# Patient Record
Sex: Male | Born: 1985 | Race: Asian | Hispanic: No | Marital: Single | Smoking: Never smoker
Health system: Southern US, Community
[De-identification: ages and names within clinical notes are randomized; demographics above are authoritative.]

## PROBLEM LIST (undated history)

## (undated) DIAGNOSIS — F840 Autistic disorder: Secondary | ICD-10-CM

## (undated) HISTORY — PX: NO PAST SURGERIES: SHX2092

---
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
## 2011-05-08 ENCOUNTER — Emergency Department (HOSPITAL_COMMUNITY)
Admission: EM | Admit: 2011-05-08 | Discharge: 2011-05-09 | Disposition: A | Discharge status: Home / Self Care | Payer: Medicare Other | Attending: Emergency Medicine | Admitting: Emergency Medicine

## 2011-05-08 ENCOUNTER — Encounter (HOSPITAL_COMMUNITY): Payer: Self-pay | Admitting: Emergency Medicine

## 2011-05-08 DIAGNOSIS — E871 Hypo-osmolality and hyponatremia: Secondary | ICD-10-CM

## 2011-05-08 DIAGNOSIS — G4700 Insomnia, unspecified: Secondary | ICD-10-CM | POA: Insufficient documentation

## 2011-05-08 DIAGNOSIS — F840 Autistic disorder: Secondary | ICD-10-CM | POA: Insufficient documentation

## 2011-05-08 DIAGNOSIS — R10819 Abdominal tenderness, unspecified site: Secondary | ICD-10-CM | POA: Insufficient documentation

## 2011-05-08 DIAGNOSIS — R109 Unspecified abdominal pain: Secondary | ICD-10-CM | POA: Insufficient documentation

## 2011-05-08 HISTORY — DX: Autistic disorder: F84.0

## 2011-05-08 LAB — CBC
HCT: 40.2 % (ref 39.0–52.0)
Hemoglobin: 13.7 g/dL (ref 13.0–17.0)
MCH: 26.9 pg (ref 26.0–34.0)
MCHC: 34.1 g/dL (ref 30.0–36.0)
MCV: 78.8 fL (ref 78.0–100.0)
Platelets: 299 10*3/uL (ref 150–400)
RBC: 5.1 MIL/uL (ref 4.22–5.81)
RDW: 12.4 % (ref 11.5–15.5)
WBC: 11.9 10*3/uL — ABNORMAL HIGH (ref 4.0–10.5)

## 2011-05-08 LAB — DIFFERENTIAL
Basophils Relative: 0 % (ref 0–1)
Eosinophils Absolute: 0.3 10*3/uL (ref 0.0–0.7)
Eosinophils Relative: 3 % (ref 0–5)
Monocytes Relative: 5 % (ref 3–12)
Neutrophils Relative %: 70 % (ref 43–77)

## 2011-05-08 MED ORDER — SODIUM CHLORIDE 0.9 % IV BOLUS (SEPSIS)
1000.0000 mL | Freq: Once | INTRAVENOUS | Status: AC
  Administered 2011-05-08: 1000 mL via INTRAVENOUS

## 2011-05-08 NOTE — ED Notes (Signed)
CBG 101 

## 2011-05-08 NOTE — ED Notes (Signed)
ZOX:[id number]<AV> Expected date:05/08/11<BR> Expected time: 9:43 PM<BR> Means of arrival:Ambulance<BR> Comments:<BR> PTAR- Autistic male, abdominal pain, increased PO H2O intake. Non-english speaking family

## 2011-05-08 NOTE — ED Provider Notes (Addendum)
History     CSN: [id number]  Arrival date & time 05/08/11  2149   First MD Initiated Contact with Patient 05/08/11 2152      Chief Complaint  Patient presents with  . Abdominal Pain  . Insomnia     HPI  History provided by the patient's brother and parents. Patient is a 26 year old autistic male who presents complaints of abdominal pain, polydipsia and polyuria for the past 2 days. Patient has continued to eat normally but parents and brother state patient has-been drinking water very frequently. Patient also seemed to indicate that he was having abdominal discomfort and pains. There is no reported aggravating or alleviating factors. There have been no reports of fever, vomiting, diarrhea or constipation. There are no reports of changes in urination. Patient has no surgical history. Patient has no other medical history. Patient's father does have history of diabetes.    Past Medical History  Diagnosis Date  . Autism     History reviewed. No pertinent past surgical history.  History reviewed. No pertinent family history.  History  Substance Use Topics  . Smoking status: Never Smoker   . Smokeless tobacco: Never Used  . Alcohol Use: No      Review of Systems  Unable to perform ROS  level V applies due to his autism  Allergies  Review of patient's allergies indicates no known allergies.  Home Medications  No current outpatient prescriptions on file.  BP 118/77  Pulse 101  Temp(Src) 97.8 F (36.6 C) (Oral)  Resp 20  SpO2 99%  Physical Exam  Nursing note and vitals reviewed. Constitutional: He appears well-developed and well-nourished. No distress.  HENT:  Head: Normocephalic and atraumatic.  Mouth/Throat: Oropharynx is clear and moist.  Cardiovascular: Normal rate and regular rhythm.   Pulmonary/Chest: Effort normal and breath sounds normal. No respiratory distress. He has no wheezes. He has no rales.  Abdominal: Soft. Normal appearance and bowel sounds are  normal. He exhibits no distension. There is no rebound, no guarding, no CVA tenderness, no tenderness at McBurney's point and negative Murphy's sign.       Obese. Diffuse mild tenderness  Neurological: He is alert.  Skin: He is diaphoretic.    ED Course  Procedures    Labs Reviewed  POCT CBG MONITORING  CBC  DIFFERENTIAL  COMPREHENSIVE METABOLIC PANEL  LIPASE, BLOOD  URINALYSIS, ROUTINE W REFLEX MICROSCOPIC   Results for orders placed during the hospital encounter of 05/08/11  CBC      Component Value Range   WBC 11.9 (*) 4.0 - 10.5 (K/uL)   RBC 5.10  4.22 - 5.81 (MIL/uL)   Hemoglobin 13.7  13.0 - 17.0 (g/dL)   HCT 78.2  95.6 - 21.3 (%)   MCV 78.8  78.0 - 100.0 (fL)   MCH 26.9  26.0 - 34.0 (pg)   MCHC 34.1  30.0 - 36.0 (g/dL)   RDW 08.6  57.8 - 46.9 (%)   Platelets 299  150 - 400 (K/uL)  DIFFERENTIAL      Component Value Range   Neutrophils Relative 70  43 - 77 (%)   Neutro Abs 8.3 (*) 1.7 - 7.7 (K/uL)   Lymphocytes Relative 22  12 - 46 (%)   Lymphs Abs 2.6  0.7 - 4.0 (K/uL)   Monocytes Relative 5  3 - 12 (%)   Monocytes Absolute 0.6  0.1 - 1.0 (K/uL)   Eosinophils Relative 3  0 - 5 (%)   Eosinophils Absolute 0.3  0.0 - 0.7 (K/uL)   Basophils Relative 0  0 - 1 (%)   Basophils Absolute 0.0  0.0 - 0.1 (K/uL)  COMPREHENSIVE METABOLIC PANEL      Component Value Range   Sodium 127 (*) 135 - 145 (mEq/L)   Potassium 3.5  3.5 - 5.1 (mEq/L)   Chloride 93 (*) 96 - 112 (mEq/L)   CO2 22  19 - 32 (mEq/L)   Glucose, Bld 105 (*) 70 - 99 (mg/dL)   BUN 6  6 - 23 (mg/dL)   Creatinine, Ser 1.61  0.50 - 1.35 (mg/dL)   Calcium 8.9  8.4 - 09.6 (mg/dL)   Total Protein 7.8  6.0 - 8.3 (g/dL)   Albumin 3.7  3.5 - 5.2 (g/dL)   AST 24  0 - 37 (U/L)   ALT 28  0 - 53 (U/L)   Alkaline Phosphatase 65  39 - 117 (U/L)   Total Bilirubin 0.7  0.3 - 1.2 (mg/dL)   GFR calc non Af Amer >90  >90 (mL/min)   GFR calc Af Amer >90  >90 (mL/min)  LIPASE, BLOOD      Component Value Range   Lipase 37   11 - 59 (U/L)  URINALYSIS, ROUTINE W REFLEX MICROSCOPIC      Component Value Range   Color, Urine YELLOW  YELLOW    APPearance CLEAR  CLEAR    Specific Gravity, Urine 1.002 (*) 1.005 - 1.030    pH 6.5  5.0 - 8.0    Glucose, UA NEGATIVE  NEGATIVE (mg/dL)   Hgb urine dipstick TRACE (*) NEGATIVE    Bilirubin Urine NEGATIVE  NEGATIVE    Ketones, ur NEGATIVE  NEGATIVE (mg/dL)   Protein, ur NEGATIVE  NEGATIVE (mg/dL)   Urobilinogen, UA 0.2  0.0 - 1.0 (mg/dL)   Nitrite NEGATIVE  NEGATIVE    Leukocytes, UA NEGATIVE  NEGATIVE   URINE MICROSCOPIC-ADD ON      Component Value Range   Squamous Epithelial / LPF RARE  RARE    RBC / HPF 0-2  <3 (RBC/hpf)  GLUCOSE, CAPILLARY      Component Value Range   Glucose-Capillary 101 (*) 70 - 99 (mg/dL)     Ct Abdomen Pelvis W Contrast  05/09/2011  *RADIOLOGY REPORT*  Clinical Data: Abdominal pain.  Insomnia and increased thirst. White cell count 11.9.  CT ABDOMEN AND PELVIS WITH CONTRAST  Technique:  Multidetector CT imaging of the abdomen and pelvis was performed following the standard protocol during bolus administration of intravenous contrast.  Contrast: OMNIPAQUE IOHEXOL 300 MG/ML IV SOLN  Comparison: None.  Findings: Lung bases are clear other than respiratory motion artifact.  The liver, spleen, gallbladder, pancreas, adrenal glands, kidneys, abdominal aorta, and retroperitoneal lymph nodes are unremarkable. The stomach and small bowel are decompressed.  Stool filled colon without wall thickening or distension.  No free air or free fluid in the abdomen.  Pelvis:  Bladder wall is not thickened.  Prostate gland is not enlarged.  No free or loculated pelvic fluid collections.  No significant pelvic lymphadenopathy.  No inflammatory changes in the sigmoid colon.  The appendix is normal.  Normal alignment of the lumbar vertebra.  No destructive bone lesions appreciated.  IMPRESSION: No acute process demonstrated in the abdomen or pelvis.  Original Report  Authenticated By: Marlon Pel, M.D.     1. Hyponatremia       MDM  10:50PM patient seen and evaluated. Patient no acute distress.   12:30 AM  patient continues to be resting well discussed with family patient's current lab results. CT still pending.     Angus Seller, PA 05/09/11 0147  Angus Seller, PA 05/09/11 [phone]

## 2011-05-08 NOTE — ED Notes (Signed)
Per ems, the patient father states the child has had increased thirst for the past 2 days and abd pain with no n/v/d and has not slept any for 2 days

## 2011-05-09 ENCOUNTER — Emergency Department (HOSPITAL_COMMUNITY): Payer: Medicare Other

## 2011-05-09 LAB — URINALYSIS, ROUTINE W REFLEX MICROSCOPIC
Bilirubin Urine: NEGATIVE
Leukocytes, UA: NEGATIVE
Nitrite: NEGATIVE
Specific Gravity, Urine: 1.002 — ABNORMAL LOW (ref 1.005–1.030)
Urobilinogen, UA: 0.2 mg/dL (ref 0.0–1.0)
pH: 6.5 (ref 5.0–8.0)

## 2011-05-09 LAB — COMPREHENSIVE METABOLIC PANEL
Albumin: 3.7 g/dL (ref 3.5–5.2)
Alkaline Phosphatase: 65 U/L (ref 39–117)
BUN: 6 mg/dL (ref 6–23)
Calcium: 8.9 mg/dL (ref 8.4–10.5)
Potassium: 3.5 mEq/L (ref 3.5–5.1)
Sodium: 127 mEq/L — ABNORMAL LOW (ref 135–145)
Total Protein: 7.8 g/dL (ref 6.0–8.3)

## 2011-05-09 LAB — GLUCOSE, CAPILLARY: Glucose-Capillary: 101 mg/dL — ABNORMAL HIGH (ref 70–99)

## 2011-05-09 LAB — URINE MICROSCOPIC-ADD ON

## 2011-05-09 LAB — SODIUM, URINE, RANDOM: Sodium, Ur: 10 mEq/L

## 2011-05-09 LAB — LIPASE, BLOOD: Lipase: 37 U/L (ref 11–59)

## 2011-05-09 MED ORDER — IOHEXOL 300 MG/ML  SOLN
100.0000 mL | Freq: Once | INTRAMUSCULAR | Status: AC | PRN
  Administered 2011-05-09: 100 mL via INTRAVENOUS

## 2011-05-09 MED ORDER — HYDROCODONE-ACETAMINOPHEN 5-325 MG PO TABS: 1.0000 | ORAL_TABLET | ORAL | Status: AC | PRN

## 2011-05-09 MED ORDER — MORPHINE SULFATE 2 MG/ML IJ SOLN
2.0000 mg | Freq: Once | INTRAMUSCULAR | Status: AC
  Administered 2011-05-09: 2 mg via INTRAVENOUS
  Filled 2011-05-09: qty 1

## 2011-05-09 NOTE — Discharge Instructions (Signed)
You were seen and evaluated today for your symptoms of abdominal pains and excessive thirst. At this time your lab to show that your sodium level is low to 2 year excessive drinking of water. It is recommended that you stop drinking plain water and only drinking juice, Gatorade or soda. You should call your primary doctor later today to schedule a followup appointment to have a recheck of your sodium level this week. If you have any worsening symptoms please return to the emergency room.  Hyponatremia  Hyponatremia is when the amount of salt (sodium) in your blood is too low. When sodium levels are low, your cells will absorb extra water and swell. The swelling happens throughout the body, but it mostly affects the brain. Severe brain swelling (cerebral edema), seizures, or coma can happen.  CAUSES   Heart, kidney, or liver problems.   Thyroid problems.   Adrenal gland problems.   Severe vomiting and diarrhea.   Certain medicines or illegal drugs.   Dehydration.   Drinking too much water.   Low-sodium diet.  SYMPTOMS   Nausea and vomiting.   Confusion.   Lethargy.   Agitation.   Headache.   Twitching or shaking (seizures).   Unconsciousness.   Appetite loss.   Muscle weakness and cramping.  DIAGNOSIS  Hyponatremia is identified by a simple blood test. Your caregiver will perform a history and physical exam to try to find the cause and type of hyponatremia. Other tests may be needed to measure the amount of sodium in your blood and urine. TREATMENT  Treatment will depend on the cause.   Fluids may be given through the vein (IV).   Medicines may be used to correct the sodium imbalance. If medicines are causing the problem, they will need to be adjusted.   Water or fluid intake may be restricted to restore proper balance.  The speed of correcting the sodium problem is very important. If the problem is corrected too fast, nerve damage (sometimes unchangeable) can  happen. HOME CARE INSTRUCTIONS   Only take medicines as directed by your caregiver. Many medicines can make hyponatremia worse. Discuss all your medicines with your caregiver.   Carefully follow any recommended diet, including any fluid restrictions.   You may be asked to repeat lab tests. Follow these directions.   Avoid alcohol and recreational drugs.  SEEK MEDICAL CARE IF:   You develop worsening nausea, fatigue, headache, confusion, or weakness.   Your original hyponatremia symptoms return.   You have problems following the recommended diet.  SEEK IMMEDIATE MEDICAL CARE IF:   You have a seizure.   You faint.   You have ongoing diarrhea or vomiting.  MAKE SURE YOU:   Understand these instructions.   Will watch your condition.   Will get help right away if you are not doing well or get worse.  Document Released: 03/18/2002 Document Revised: 12/08/2010 Document Reviewed: 09/12/2010 [facility] Patient Information 2012 [place], [place].   RESOURCE GUIDE  Dental Problems  Patients with Medicaid: [facility] [phone] [address]                                           [phone] Phone:  [phone]  Phone:  [phone]  If unable to pay or uninsured, contact:  [facility] or [facility]. to become qualified for the adult dental clinic.  Chronic Pain Problems Contact [facility]  [phone] Patients need to be referred by their primary care doctor.  Insufficient Money for Medicine Contact [employer]:  call "211" or [facility] [phone].  No Primary Care Doctor Call [facility]  [phone] Other agencies that provide inexpensive medical care    [facility]  [phone]    [facility]  [phone]    [facility]  [phone]    [facility]  [phone]    [facility]  [phone]     [facility]  [phone]  Psychological Services [facility]  [phone] [facility]  [phone] [facility]   [phone] (emergency services [phone])  Substance Abuse Resources [facility]  [phone] [facility] [phone] [facility] [phone] Floyd[facility] Flock [phone] Residential & Outpatient Substance Abuse Program  [phone]  Abuse/Neglect [facility] Child Abuse Hotline [phone] [facility] Child Abuse Hotline 684-674-7741 (After Hours)  Emergency Shelter [facility] [phone]  Maternity Homes [facility] at [facility] [place] [facility] [phone] [facility] [phone]  MRSA Hotline #:   303 683 2043    [facility] Resources  [facility]     [employer]                          [facility] Dept. [address] [place]                       [address]      [address]  Blondell Reveal Phone:  [phone]                                   Phone:  [phone]                 Phone:  [phone]  [facility] Phone:  [phone]  [facility] Child Abuse Hotline 902-434-9330 [phone] (After Hours)

## 2011-05-09 NOTE — ED Provider Notes (Addendum)
Medical screening examination/treatment/procedure(s) were conducted as a shared visit with non-physician practitioner(s) and myself.  I personally evaluated the patient during the encounter Pt noted to have hyponatremia.  No old labs on file to compare to to assess chronicity.  Parents do mention increased water intake.  Mental status has been unchanged.  Pt does have autism.  ?psyychogenic polydypsia.  No abnormalities noted on CT.  Will check urine and serum osmolality.  Will have him follow up with his PCP this week.  Limit free water intake. Celene Kras, MD 05/09/11 1610  Celene Kras, MD 05/09/11 [phone]

## 2012-01-12 NOTE — H&P (Signed)
  Nehemiah Mcfarren is an 26 y.o. male.   Chief Complaint: "Bad Teeth" HPI: Marquist is a 26 year old male with a history of severe autism.  He was referred to my office for evaluation and removal of several carious teeth. We will be performing the surgical removal of the offending teeth in the operating room due to Wyland's history of severe autism.  PMHx:  Past Medical History  Diagnosis Date  . Autism     PSx: No past surgical history on file.  Family Hx: Father is diabetic  Social History:  reports that he has never smoked. He has never used smokeless tobacco. He reports that he does not drink alcohol. His drug history not on file.  Allergies: No Known Allergies  Meds:  No prescriptions prior to admission    Labs: No results found for this or any previous visit (from the past 48 hour(s)).  Radiology:  ROS: A comprehensive review of systems was negative.  Vitals: BP: 138/86, Pulse:105,  SpO2:98%, Resp: 44,  Temp: 37.3 degrees celsius.  Physical Exam: General appearance: alert, cooperative and slowed mentation Head: Normocephalic, without obvious abnormality, atraumatic Eyes: conjunctivae/corneas clear. PERRL, EOM's intact. Fundi benign. Ears: normal TM's and external ear canals both ears Nose: Nares normal. Septum midline. Mucosa normal. No drainage or sinus tenderness. Throat: lips, mucosa, and tongue normal; teeth and gums normal Neck: no adenopathy, no carotid bruit, no JVD, supple, symmetrical, trachea midline and thyroid not enlarged, symmetric, no tenderness/mass/nodules Resp: clear to auscultation bilaterally Chest wall: no tenderness Cardio: regular rate and rhythm, S1, S2 normal, no murmur, click, rub or gallop GI: soft, non-tender; bowel sounds normal; no masses,  no organomegaly Extremities: extremities normal, atraumatic, no cyanosis or edema Pulses: 2+ and symmetric Skin: Skin color, texture, turgor normal. No rashes or lesions Lymph nodes: Cervical,  supraclavicular, and axillary nodes normal. Neurologic: Grossly normal Oral exam: Carious teeth #1, 2, 3, 17, 18, 30, 31.  Impacted teeth 16, 17 (soft tissue), 18 (soft tissue), 31 (soft tissue), 32    Assessment/Plan Mr. Bergeson has a history of autism and multiple carious teeth and impacted teeth.  He will be going to the OR for extraction of #1, 2, 3, 16, 17, 18, 30, 31, 32 and any other necessary teeth.  Lockney,Amarie Tarte L  01/12/2012, 5:06 PM

## 2012-01-17 ENCOUNTER — Encounter (HOSPITAL_COMMUNITY): Payer: Self-pay | Admitting: Pharmacy Technician

## 2012-01-19 ENCOUNTER — Encounter (HOSPITAL_COMMUNITY)
Admission: RE | Admit: 2012-01-19 | Discharge: 2012-01-19 | Disposition: A | Discharge status: Home / Self Care | Payer: Medicare Other | Source: Ambulatory Visit | Attending: Oral and Maxillofacial Surgery | Admitting: Oral and Maxillofacial Surgery

## 2012-01-19 ENCOUNTER — Encounter (HOSPITAL_COMMUNITY): Payer: Self-pay

## 2012-01-19 NOTE — Pre-Procedure Instructions (Signed)
[address] Eliceo Gladu  01/19/2012   Your procedure is scheduled on:  01-25-2012  Report to [facility] at 6:30 AM.  Call this number if you have problems the morning of surgery: [phone]   Remember:   Do not eat food or drink:After Midnight.      Take these medicines the morning of surgery with A SIP OF WATER: none   Do not wear jewelry, make-up or nail polish.  Do not wear lotions, powders, or perfumes. You may wear deodorant.  Do not shave 48 hours prior to surgery. Men may shave face and neck.  Do not bring valuables to the hospital.  Contacts, dentures or bridgework may not be worn into surgery.  . .   Patients discharged the day of surgery will not be allowed to drive home.    Name and phone number of your driver: ___________________    Special Instructions: Shower using CHG 2 nights before surgery and the night before surgery.  If you shower the day of surgery use CHG.  Use special wash - you have one bottle of CHG for all showers.  You should use approximately 1/3 of the bottle for each shower.   Please read over the following fact sheets that you were given: Pain Booklet and Surgical Site Infection Prevention

## 2012-01-19 NOTE — Progress Notes (Signed)
Spoke with Jackson Montoya, anesthesia PA. Does not need to speak with family today pt. Healthy no medical problems ,not on any routine medications.

## 2012-01-24 MED ORDER — DEXTROSE 5 % IV SOLN
3.0000 g | INTRAVENOUS | Status: AC
  Administered 2012-01-25: 3 g via INTRAVENOUS
  Filled 2012-01-24 (×3): qty 3000

## 2012-01-25 ENCOUNTER — Encounter (HOSPITAL_COMMUNITY): Payer: Self-pay | Admitting: *Deleted

## 2012-01-25 ENCOUNTER — Encounter (HOSPITAL_COMMUNITY)
Admission: RE | Disposition: A | Discharge status: Home / Self Care | Payer: Self-pay | Source: Ambulatory Visit | Attending: Oral and Maxillofacial Surgery

## 2012-01-25 ENCOUNTER — Encounter (HOSPITAL_COMMUNITY): Payer: Self-pay | Admitting: Critical Care Medicine

## 2012-01-25 ENCOUNTER — Ambulatory Visit (HOSPITAL_COMMUNITY): Payer: Medicare Other | Admitting: Critical Care Medicine

## 2012-01-25 ENCOUNTER — Ambulatory Visit (HOSPITAL_COMMUNITY)
Admission: RE | Admit: 2012-01-25 | Discharge: 2012-01-25 | Disposition: A | Discharge status: Home / Self Care | Payer: Medicare Other | Source: Ambulatory Visit | Attending: Oral and Maxillofacial Surgery | Admitting: Oral and Maxillofacial Surgery

## 2012-01-25 DIAGNOSIS — K011 Impacted teeth: Secondary | ICD-10-CM | POA: Diagnosis present

## 2012-01-25 DIAGNOSIS — K029 Dental caries, unspecified: Secondary | ICD-10-CM

## 2012-01-25 DIAGNOSIS — K006 Disturbances in tooth eruption: Secondary | ICD-10-CM | POA: Insufficient documentation

## 2012-01-25 DIAGNOSIS — F840 Autistic disorder: Secondary | ICD-10-CM | POA: Insufficient documentation

## 2012-01-25 HISTORY — PX: TOOTH EXTRACTION: SHX859

## 2012-01-25 SURGERY — EXTRACTION, TOOTH, MOLAR
Anesthesia: General | Site: Mouth | Laterality: Bilateral | Wound class: Clean Contaminated

## 2012-01-25 MED ORDER — LIDOCAINE HCL (CARDIAC) 20 MG/ML IV SOLN
INTRAVENOUS | Status: DC | PRN
  Administered 2012-01-25: 100 mg via INTRAVENOUS

## 2012-01-25 MED ORDER — CEFAZOLIN SODIUM 1-5 GM-% IV SOLN
INTRAVENOUS | Status: AC
  Filled 2012-01-25: qty 50

## 2012-01-25 MED ORDER — OXYCODONE HCL 5 MG/5ML PO SOLN: 5.0000 mg | Freq: Once | ORAL | Status: DC | PRN

## 2012-01-25 MED ORDER — FENTANYL CITRATE 0.05 MG/ML IJ SOLN
INTRAMUSCULAR | Status: DC | PRN
  Administered 2012-01-25: 150 ug via INTRAVENOUS
  Administered 2012-01-25: 50 ug via INTRAVENOUS

## 2012-01-25 MED ORDER — CEFAZOLIN SODIUM-DEXTROSE 2-3 GM-% IV SOLR
INTRAVENOUS | Status: AC
  Filled 2012-01-25: qty 50

## 2012-01-25 MED ORDER — ISOPROPYL ALCOHOL 70 % SOLN
Status: DC | PRN
  Administered 2012-01-25: 1 via TOPICAL

## 2012-01-25 MED ORDER — LIDOCAINE-EPINEPHRINE 2 %-1:100000 IJ SOLN
INTRAMUSCULAR | Status: AC
  Filled 2012-01-25: qty 6.8

## 2012-01-25 MED ORDER — BUPIVACAINE-EPINEPHRINE 0.5% -1:200000 IJ SOLN
INTRAMUSCULAR | Status: DC | PRN
  Administered 2012-01-25: 4 mL

## 2012-01-25 MED ORDER — BUPIVACAINE-EPINEPHRINE (PF) 0.5% -1:200000 IJ SOLN
INTRAMUSCULAR | Status: AC
  Filled 2012-01-25: qty 7.2

## 2012-01-25 MED ORDER — THROMBIN 20000 UNITS EX KIT
PACK | CUTANEOUS | Status: DC | PRN
  Administered 2012-01-25: 09:00:00 via TOPICAL

## 2012-01-25 MED ORDER — PROPOFOL 10 MG/ML IV BOLUS
INTRAVENOUS | Status: DC | PRN
  Administered 2012-01-25: 200 mg via INTRAVENOUS

## 2012-01-25 MED ORDER — SUCCINYLCHOLINE CHLORIDE 20 MG/ML IJ SOLN
INTRAMUSCULAR | Status: DC | PRN
  Administered 2012-01-25: 100 mg via INTRAVENOUS

## 2012-01-25 MED ORDER — DROPERIDOL 2.5 MG/ML IJ SOLN: 0.6250 mg | INTRAMUSCULAR | Status: DC | PRN

## 2012-01-25 MED ORDER — ONDANSETRON HCL 4 MG/2ML IJ SOLN
INTRAMUSCULAR | Status: DC | PRN
  Administered 2012-01-25: 4 mg via INTRAVENOUS

## 2012-01-25 MED ORDER — DEXAMETHASONE SODIUM PHOSPHATE 4 MG/ML IJ SOLN
INTRAMUSCULAR | Status: DC | PRN
  Administered 2012-01-25: 4 mg via INTRAVENOUS

## 2012-01-25 MED ORDER — OXYCODONE HCL 5 MG PO TABS: 5.0000 mg | ORAL_TABLET | Freq: Once | ORAL | Status: DC | PRN

## 2012-01-25 MED ORDER — ARTIFICIAL TEARS OP OINT
TOPICAL_OINTMENT | OPHTHALMIC | Status: DC | PRN
  Administered 2012-01-25: 1 via OPHTHALMIC

## 2012-01-25 MED ORDER — LACTATED RINGERS IV SOLN
INTRAVENOUS | Status: DC | PRN
  Administered 2012-01-25: 08:00:00 via INTRAVENOUS

## 2012-01-25 MED ORDER — OXYMETAZOLINE HCL 0.05 % NA SOLN
NASAL | Status: DC | PRN
  Administered 2012-01-25: 1 via NASAL

## 2012-01-25 MED ORDER — MIDAZOLAM HCL 5 MG/5ML IJ SOLN
INTRAMUSCULAR | Status: DC | PRN
  Administered 2012-01-25: 2 mg via INTRAVENOUS

## 2012-01-25 MED ORDER — OXYMETAZOLINE HCL 0.05 % NA SOLN
NASAL | Status: DC | PRN
  Administered 2012-01-25: 2 via NASAL

## 2012-01-25 MED ORDER — LIDOCAINE-EPINEPHRINE 2 %-1:100000 IJ SOLN
INTRAMUSCULAR | Status: AC
  Filled 2012-01-25: qty 1.7

## 2012-01-25 MED ORDER — HYDROMORPHONE HCL PF 1 MG/ML IJ SOLN: 0.2500 mg | INTRAMUSCULAR | Status: DC | PRN

## 2012-01-25 SURGICAL SUPPLY — 49 items
ALCOHOL ISOPROPYL (RUBBING) (MISCELLANEOUS) ×2 IMPLANT
ATTRACTOMAT 16X20 MAGNETIC DRP (DRAPES) IMPLANT
BUR CROSS CUT (BURR) ×2
BUR CROSS CUT FISSURE 1.6 (BURR) ×1 IMPLANT
BUR RND FLUTED 2.5 (BURR) IMPLANT
BUR SRG MED 1.2XXCUT FSSR (BURR) IMPLANT
BUR SRG MED 1.6XXCUT FSSR (BURR) IMPLANT
BUR SRG MED 2.1XXCUT FSSR (BURR) IMPLANT
BUR STRYKR 2.5 FLUT MED (BURR) IMPLANT
BURR SRG MED 1.2XXCUT FSSR (BURR)
BURR SRG MED 1.6XXCUT FSSR (BURR)
BURR SRG MED 2.1XXCUT FSSR (BURR) ×1
CANISTER SUCTION 2500CC (MISCELLANEOUS) ×2 IMPLANT
CLEANER TIP ELECTROSURG 2X2 (MISCELLANEOUS) IMPLANT
CLOTH BEACON ORANGE TIMEOUT ST (SAFETY) ×2 IMPLANT
CONT SPEC STER OR (MISCELLANEOUS) IMPLANT
COVER SURGICAL LIGHT HANDLE (MISCELLANEOUS) ×2 IMPLANT
DRESSING ADAPTIC 1/2  N-ADH (PACKING) ×2 IMPLANT
ELECT COATED BLADE 2.86 ST (ELECTRODE) IMPLANT
ELECT REM PT RETURN 9FT ADLT (ELECTROSURGICAL)
ELECTRODE REM PT RTRN 9FT ADLT (ELECTROSURGICAL) IMPLANT
GAUZE PACKING FOLDED 2  STR (GAUZE/BANDAGES/DRESSINGS) ×1
GAUZE PACKING FOLDED 2 STR (GAUZE/BANDAGES/DRESSINGS) ×1 IMPLANT
GLOVE BIO SURGEON STRL SZ 6.5 (GLOVE) ×2 IMPLANT
GLOVE BIO SURGEON STRL SZ7.5 (GLOVE) ×1 IMPLANT
GLOVE BIOGEL PI IND STRL 6.5 (GLOVE) ×1 IMPLANT
GLOVE BIOGEL PI IND STRL 7.5 (GLOVE) ×1 IMPLANT
GLOVE BIOGEL PI INDICATOR 6.5 (GLOVE) ×1
GLOVE BIOGEL PI INDICATOR 7.5 (GLOVE) ×1
GLOVE ORTHO TXT STRL SZ7.5 (GLOVE) ×2 IMPLANT
GOWN STRL NON-REIN LRG LVL3 (GOWN DISPOSABLE) ×4 IMPLANT
KIT BASIN OR (CUSTOM PROCEDURE TRAY) ×2 IMPLANT
KIT ROOM TURNOVER OR (KITS) ×2 IMPLANT
NDL BLUNT 16X1.5 OR ONLY (NEEDLE) ×1 IMPLANT
NDL DENTAL 27 LONG (NEEDLE) ×2 IMPLANT
NEEDLE BLUNT 16X1.5 OR ONLY (NEEDLE) ×2 IMPLANT
NEEDLE DENTAL 27 LONG (NEEDLE) ×4 IMPLANT
NS IRRIG 1000ML POUR BTL (IV SOLUTION) ×2 IMPLANT
PAD ARMBOARD 7.5X6 YLW CONV (MISCELLANEOUS) ×4 IMPLANT
PENCIL BUTTON HOLSTER BLD 10FT (ELECTRODE) IMPLANT
SOLUTION BETADINE 4OZ (MISCELLANEOUS) ×1 IMPLANT
SPONGE GAUZE 4X4 12PLY (GAUZE/BANDAGES/DRESSINGS) IMPLANT
SUT CHROMIC 3 0 PS 2 (SUTURE) ×3 IMPLANT
SYR 50ML SLIP (SYRINGE) ×2 IMPLANT
TOOTHBRUSH ADULT (PERSONAL CARE ITEMS) ×2 IMPLANT
TOWEL OR 17X24 6PK STRL BLUE (TOWEL DISPOSABLE) ×2 IMPLANT
TOWEL OR 17X26 10 PK STRL BLUE (TOWEL DISPOSABLE) ×2 IMPLANT
TRAY ENT MC OR (CUSTOM PROCEDURE TRAY) ×2 IMPLANT
WATER STERILE IRR 1000ML POUR (IV SOLUTION) ×2 IMPLANT

## 2012-01-25 NOTE — Transfer of Care (Signed)
Immediate Anesthesia Transfer of Care Note  Patient: Jackson Montoya  Procedure(s) Performed: Procedure(s) (LRB) with comments: EXTRACTION MOLARS (Bilateral) - Extraction of teeth 1,2,3,16,17,18,30,31,32  Patient Location: PACU  Anesthesia Type: General  Level of Consciousness: awake, alert  and oriented  Airway & Oxygen Therapy: Patient Spontanous Breathing and Patient connected to nasal cannula oxygen  Post-op Assessment: Report given to PACU RN, Post -op Vital signs reviewed and stable and Patient moving all extremities X 4  Post vital signs: Reviewed and stable  Complications: No apparent anesthesia complications

## 2012-01-25 NOTE — Anesthesia Procedure Notes (Signed)
Procedure Name: Intubation Date/Time: 01/25/2012 8:34 AM Performed by: Elon Alas Pre-anesthesia Checklist: Emergency Drugs available, Patient identified, Timeout performed, Suction available and Patient being monitored Patient Re-evaluated:Patient Re-evaluated prior to inductionOxygen Delivery Method: Circle system utilized Intubation Type: IV induction and Cricoid Pressure applied Ventilation: Mask ventilation with difficulty Laryngoscope Size: Mac and 4 Grade View: Grade III Nasal Tubes: Nasal prep performed, Magill forceps- large, utilized and Nasal Rae Tube size: 7.5 mm Number of attempts: 1 Placement Confirmation: positive ETCO2,  ETT inserted through vocal cords under direct vision and breath sounds checked- equal and bilateral Tube secured with: Tape Dental Injury: Teeth and Oropharynx as per pre-operative assessment

## 2012-01-25 NOTE — Brief Op Note (Signed)
01/25/2012  8:28 AM  PATIENT:  Jackson Montoya  26 y.o. male  PRE-OPERATIVE DIAGNOSIS:  Autism; Carious and Impacted teeth #'s 1,2,3,16,17,18,30,31 and 32   POST-OPERATIVE DIAGNOSIS:  Autism; Carious and Impacted teeth #'s 1,2,3,16,17,18,30,31 and 32   PROCEDURE:  Procedure(s): EXTRACTION MOLARS #'s 1,2,3,16,17,18,30,31 and 32   SURGEON:  Surgeon(s): Francene Finders, DDS  PHYSICIAN ASSISTANT: None  ASSISTANTS: none   ANESTHESIA:   general  EBL:   Minimal  DRAINS: none   LOCAL MEDICATIONS USED:  MARCAINE (4 carpules)   and LIDOCAINE (7 carpules)  SPECIMEN:  No Specimen  DISPOSITION OF SPECIMEN:  N/A  COUNTS:  YES  DICTATION: .Note written in EPIC  PLAN OF CARE: Discharge to home after PACU  PATIENT DISPOSITION:  PACU - hemodynamically stable.   Delay start of Pharmacological VTE agent (>24hrs) due to surgical blood loss or risk of bleeding:  not applicable

## 2012-01-25 NOTE — Preoperative (Signed)
Beta Blockers   Reason not to administer Beta Blockers:Not Applicable 

## 2012-01-25 NOTE — Anesthesia Preprocedure Evaluation (Addendum)
Anesthesia Evaluation  Patient identified by MRN, date of birth, ID band Patient awake    Reviewed: Allergy & Precautions, H&P , NPO status , Patient's Chart, lab work & pertinent test results  History of Anesthesia Complications Negative for: history of anesthetic complications  Airway Mallampati: II TM Distance: >3 FB Neck ROM: Full    Dental  (+) Dental Advisory Given and Teeth Intact   Pulmonary neg pulmonary ROS,    Pulmonary exam normal       Cardiovascular negative cardio ROS      Neuro/Psych PSYCHIATRIC DISORDERS autismnegative neurological ROS     GI/Hepatic negative GI ROS, Neg liver ROS,   Endo/Other  Morbid obesity  Renal/GU negative Renal ROS     Musculoskeletal   Abdominal   Peds  Hematology negative hematology ROS (+)   Anesthesia Other Findings   Reproductive/Obstetrics                          Anesthesia Physical Anesthesia Plan  ASA: III  Anesthesia Plan: General   Post-op Pain Management:    Induction: Intravenous  Airway Management Planned: Nasal ETT  Additional Equipment:   Intra-op Plan:   Post-operative Plan: Extubation in OR  Informed Consent: I have reviewed the patients History and Physical, chart, labs and discussed the procedure including the risks, benefits and alternatives for the proposed anesthesia with the patient or authorized representative who has indicated his/her understanding and acceptance.   Dental advisory given  Plan Discussed with: CRNA, Anesthesiologist and Surgeon  Anesthesia Plan Comments:        Anesthesia Quick Evaluation

## 2012-01-25 NOTE — Interval H&P Note (Signed)
History and Physical Interval Note:  01/25/2012 7:30 AM  Jackson Montoya  has presented today for surgery, with the diagnosis of Autism/ext 1,2,3,16,17,18,30,31 and 32   The various methods of treatment have been discussed with the patient and family. After consideration of risks, benefits and other options for treatment, the patient has consented to  Procedure(s) (LRB) with comments: EXTRACTION MOLARS (Bilateral) as a surgical intervention .  The patient's history has been reviewed, patient examined, no change in status, stable for surgery.  I have reviewed the patient's chart and labs.  Questions were answered to the patient's satisfaction.     [place],Karnisha Lefebre L

## 2012-01-25 NOTE — Anesthesia Postprocedure Evaluation (Signed)
Anesthesia Post Note  Patient: Jackson Montoya  Procedure(s) Performed: Procedure(s) (LRB): EXTRACTION MOLARS (Bilateral)  Anesthesia type: general  Patient location: PACU  Post pain: Pain level controlled  Post assessment: Patient's Cardiovascular Status Stable  Last Vitals:  Filed Vitals:   01/25/12 1045  BP: 127/89  Pulse: 96  Temp:   Resp: 23    Post vital signs: Reviewed and stable  Level of consciousness: sedated  Complications: No apparent anesthesia complications

## 2012-01-25 NOTE — Op Note (Signed)
01/25/2012  8:31 AM  PATIENT:  Jackson Montoya  26 y.o. male  PRE-OPERATIVE DIAGNOSIS: Autism; Carious and Impacted teeth #'s 1,2,3,16,17,18,30,31 and 32    POST-OPERATIVE DIAGNOSIS:  Autism; Carious and Impacted teeth #'s 1,2,3,16,17,18,30,31 and 32   INDICATIONS FOR PROCEDURE: Jackson Montoya is a 26 year old male with a history of Autism.  He was referred to my office for the extraction of #1, 2, 3, 16, 17, 18, 30, 31, and #32. It was deemed necessary to take Jackson Montoya to the OR for extractions due to his medical history.  PROCEDURE:  Procedure(s): EXTRACTION MOLARS  #'s 1,2,3,16,17,18,30,31 and 32   SURGEON:  Surgeon(s): Francene Finders, DDS  PHYSICIAN ASSISTANT: None  ASSISTANTS: none   PROCEDURE IN DETAIL:  The patient was seen in the preoperative area. All questions were answered the history and physical was updated and verified.  The consent was reviewed and signed .  The patient was taken to the operating room by the anesthesia service.   Patient was placed on the table in the supine position and intubated. The patient was prepped and draped in the usual sterile fashion for all maxillofacial surgery procedures.  A moisten raytec was placed in the patient oropharynx.  2% Lidocaine with 1:100,000 epinephrine was then placed into the patient's maxilla and mandible gingiva.   Next a 15 blade was used to make a full thickness  mucoperiosteal flap along the sulcus of teeth.  Bone was then removed with hand instruments and drill.  Extraction of #'s 1,2,3,16,17,18,30,31 and 32 was performed.  Bone was smoothed with bone file.  Copious irrigation with normal saline was performed and then 3.0 chromic was used to close the wound.  All counts were correct times two.  Patient was extubated and taken to the PACU were she recovered well.   ANESTHESIA:   general  EBL:   minimal  DRAINS: none   LOCAL MEDICATIONS USED:  0.5% MARCAINE with 1:200,000 epinephrine  , 2% LIDOCAINE with  1:100,000  SPECIMEN:  No Specimen  DISPOSITION OF SPECIMEN:  N/A  COUNTS:  YES  PLAN OF CARE: Discharge to home after PACU  PATIENT DISPOSITION:  PACU - hemodynamically stable.   Delay start of Pharmacological VTE agent (>24hrs) due to surgical blood loss or risk of bleeding:  not applicable

## 2012-01-26 ENCOUNTER — Encounter (HOSPITAL_COMMUNITY): Payer: Self-pay | Admitting: Oral and Maxillofacial Surgery

## 2013-09-03 IMAGING — CT CT ABD-PELV W/ CM
1 of 3 series · 15 of 32 positions shown, 19 images · IV contrast (100 ML OMNI 300)
Comparison: None.

CLINICAL DATA: Abdominal pain.  Insomnia and increased thirst.
White cell count 11.9.

CT ABDOMEN AND PELVIS WITH CONTRAST
TECHNIQUE: Multidetector CT imaging of the abdomen and pelvis was
performed following the standard protocol during bolus
administration of intravenous contrast.
Contrast: 100mL OMNIPAQUE IOHEXOL 300 MG/ML IV SOLN

[Series 2: abd/pel with · axial · 0.74mm/px · z∈[+1206,+1641]mm · 15 of 97 slices shown, 19 images]
[im 5/97  soft-tissue]
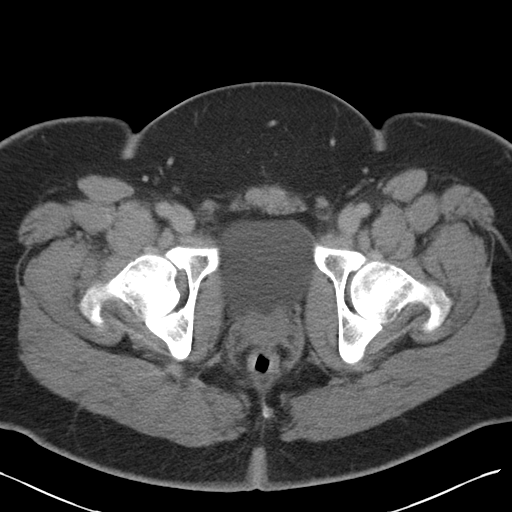
[im 5/97  bone]
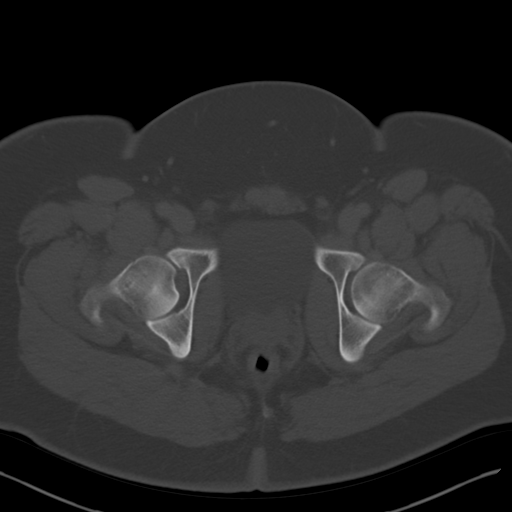
[im 14/97  soft-tissue]
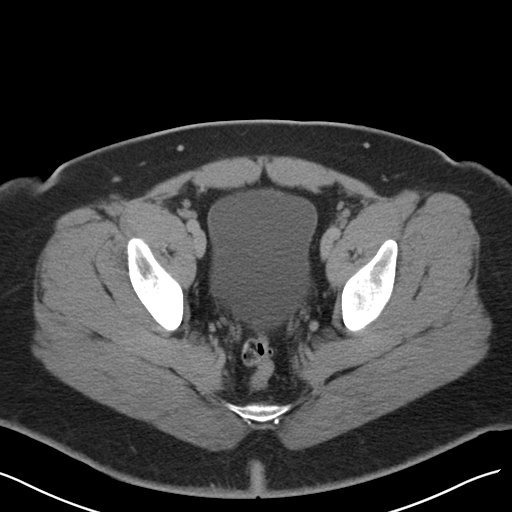
[im 22/97  soft-tissue]
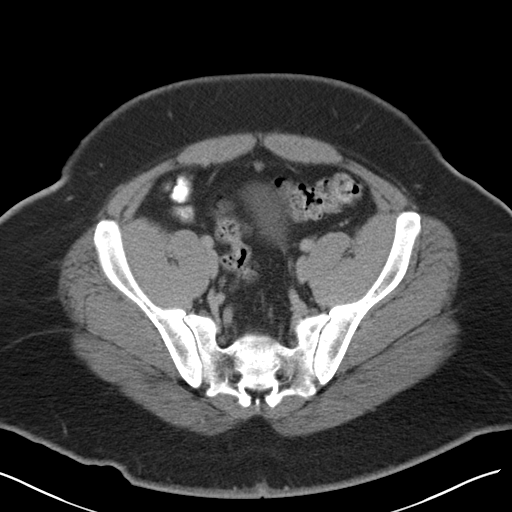
[im 27/97  soft-tissue]
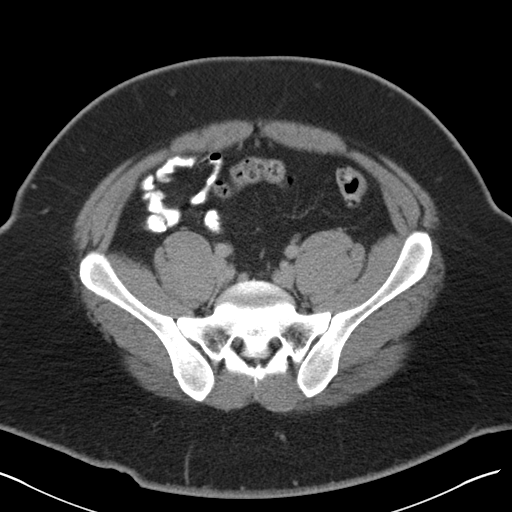
[im 35/97  soft-tissue]
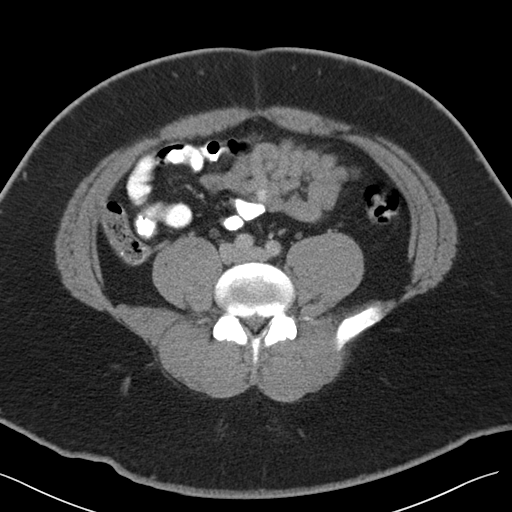
[im 40/97  soft-tissue]
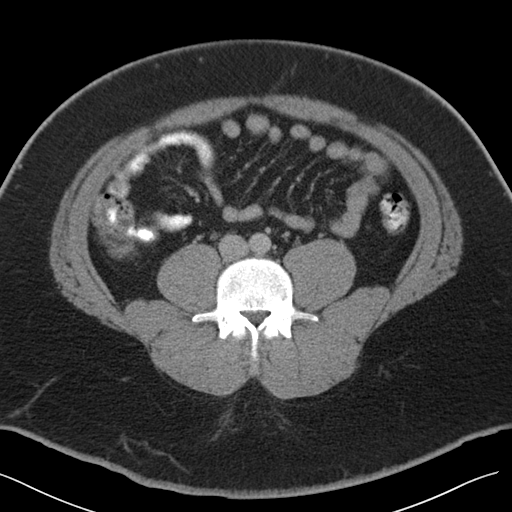
[im 49/97  soft-tissue]
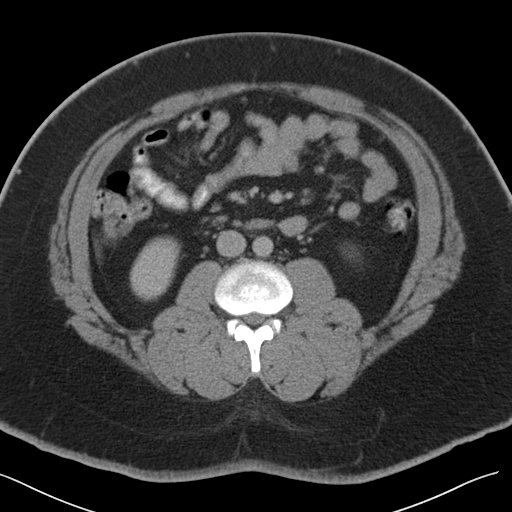
[im 57/97  soft-tissue]
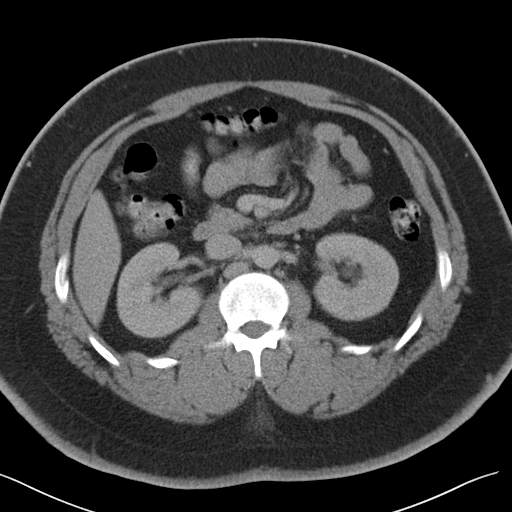
[im 62/97  soft-tissue]
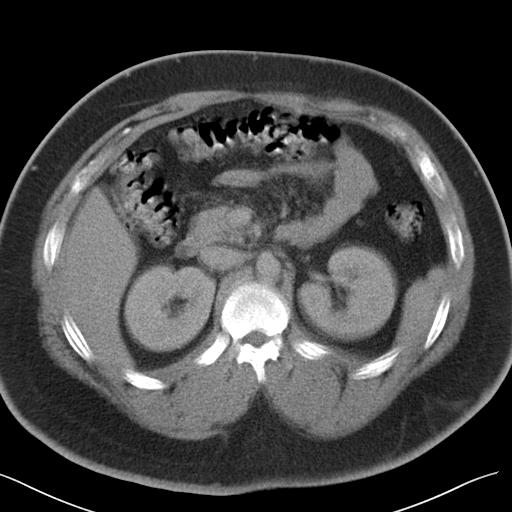
[im 62/97  bone]
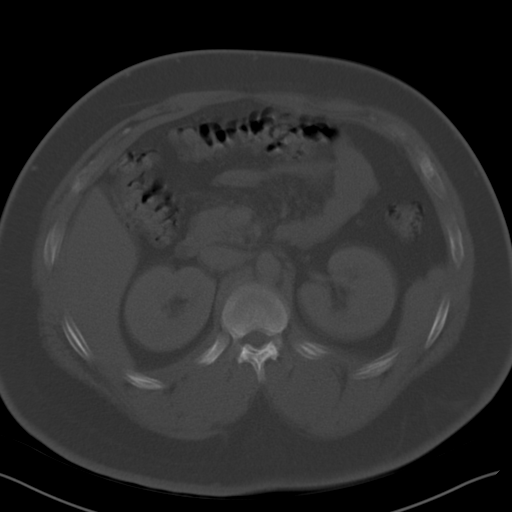
[im 70/97  soft-tissue]
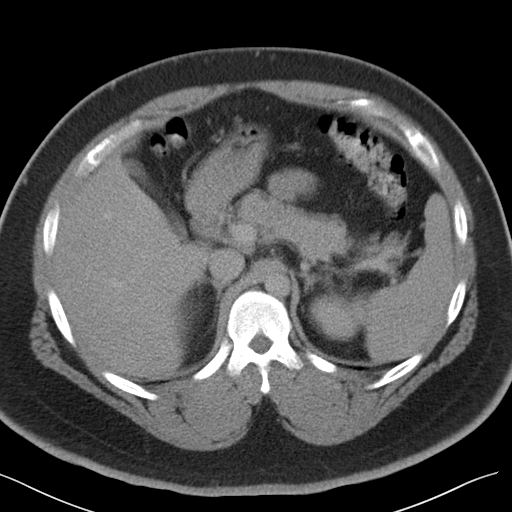
[im 75/97  soft-tissue]
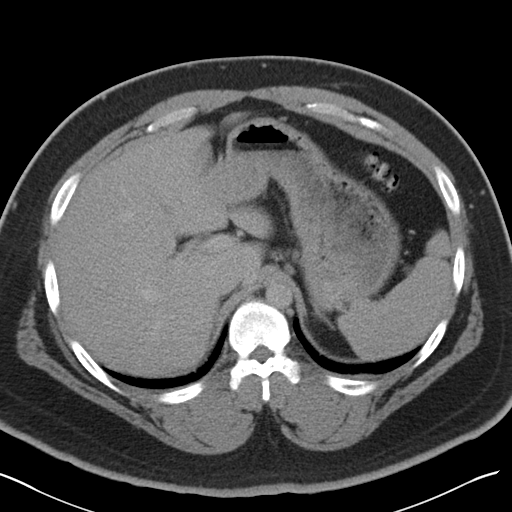
[im 79/97  lung]
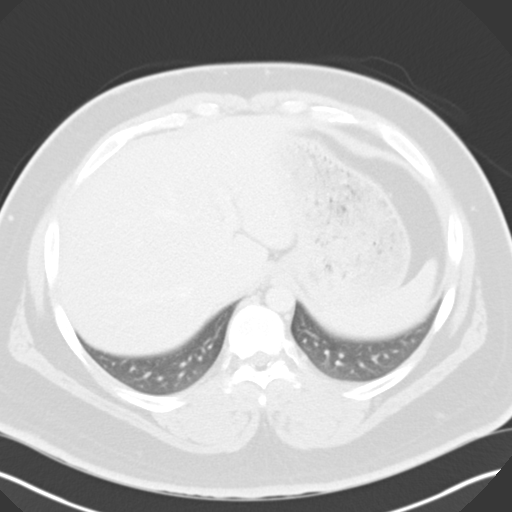
[im 83/97  soft-tissue]
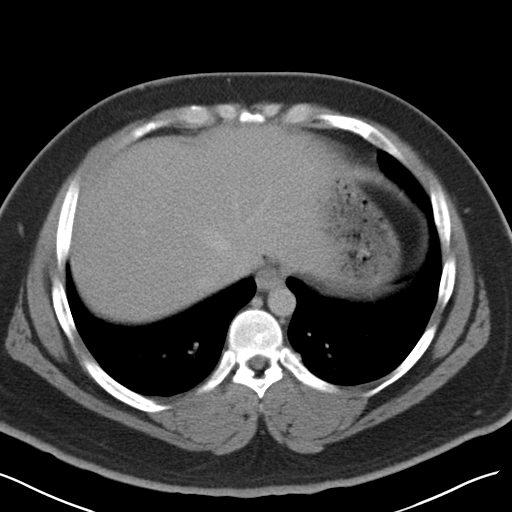
[im 83/97  lung]
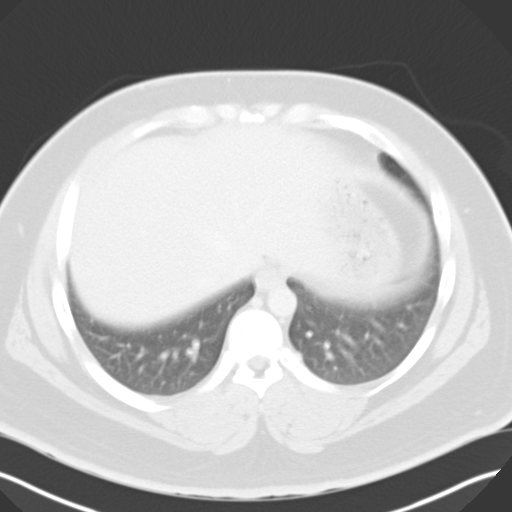
[im 88/97  lung]
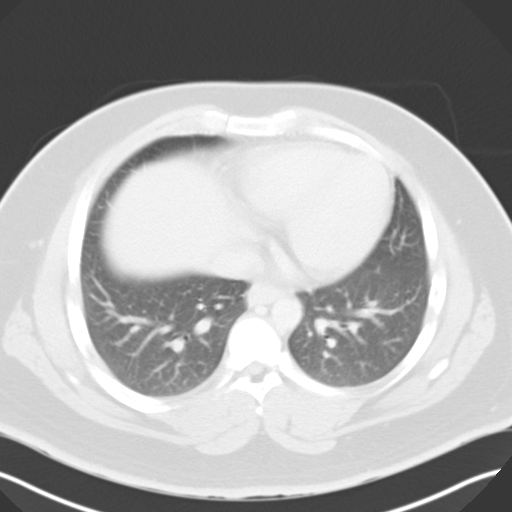
[im 92/97  soft-tissue]
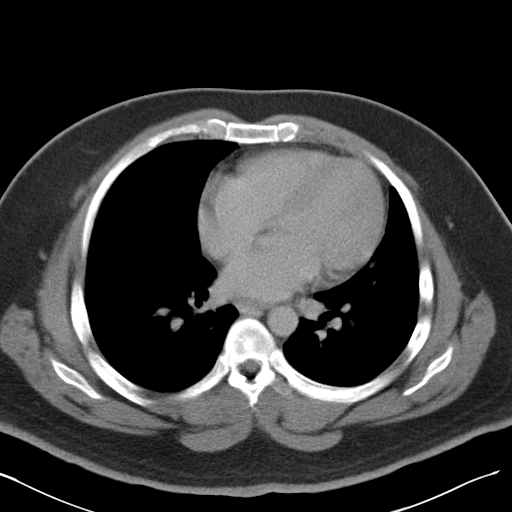
[im 92/97  lung]
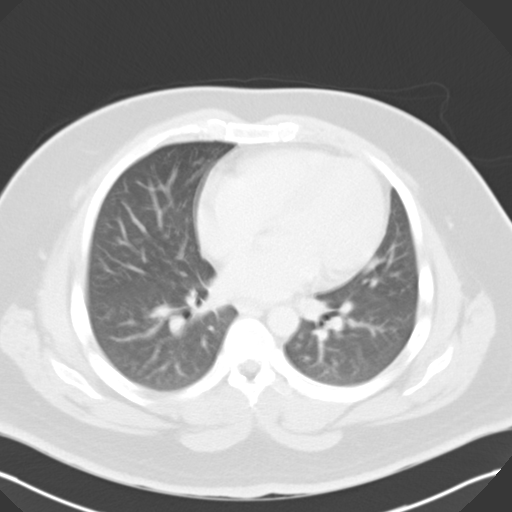

[15 of 32 positions shown; findings below may reference images not displayed]

FINDINGS: Lung bases are clear other than respiratory motion
artifact.

The liver, spleen, gallbladder, pancreas, adrenal glands, kidneys,
abdominal aorta, and retroperitoneal lymph nodes are unremarkable.
The stomach and small bowel are decompressed.  Stool filled colon
without wall thickening or distension.  No free air or free fluid
in the abdomen.

Pelvis:  Bladder wall is not thickened.  Prostate gland is not
enlarged.  No free or loculated pelvic fluid collections.  No
significant pelvic lymphadenopathy.  No inflammatory changes in the
sigmoid colon.  The appendix is normal.  Normal alignment of the
lumbar vertebra.  No destructive bone lesions appreciated.
IMPRESSION: No acute process demonstrated in the abdomen or pelvis.

## 2016-12-06 DIAGNOSIS — R05 Cough: Secondary | ICD-10-CM | POA: Diagnosis not present

## 2016-12-06 DIAGNOSIS — J019 Acute sinusitis, unspecified: Secondary | ICD-10-CM | POA: Diagnosis not present

## 2017-08-25 DIAGNOSIS — T148XXA Other injury of unspecified body region, initial encounter: Secondary | ICD-10-CM | POA: Diagnosis not present

## 2017-08-25 DIAGNOSIS — L282 Other prurigo: Secondary | ICD-10-CM | POA: Diagnosis not present

## 2017-10-27 ENCOUNTER — Ambulatory Visit (HOSPITAL_COMMUNITY)
Admission: EM | Admit: 2017-10-27 | Discharge: 2017-10-27 | Disposition: A | Discharge status: Home / Self Care | Payer: Medicare Other | Attending: Internal Medicine | Admitting: Internal Medicine

## 2017-10-27 ENCOUNTER — Encounter (HOSPITAL_COMMUNITY): Payer: Self-pay

## 2017-10-27 DIAGNOSIS — B349 Viral infection, unspecified: Secondary | ICD-10-CM | POA: Diagnosis not present

## 2017-10-27 MED ORDER — CETIRIZINE HCL 10 MG PO TABS: 10.0000 mg | ORAL_TABLET | Freq: Every day | ORAL | 0 refills | Status: AC

## 2017-10-27 MED ORDER — BENZONATATE 100 MG PO CAPS: 100.0000 mg | ORAL_CAPSULE | Freq: Three times a day (TID) | ORAL | 0 refills | Status: AC

## 2017-10-27 NOTE — Discharge Instructions (Signed)
Tessalon for cough. Start zyrtec for nasal congestion/drainage. You can use over the counter nasal saline rinse such as neti pot for nasal congestion. Keep hydrated, your urine should be clear to pale yellow in color. Tylenol/motrin for fever and pain. Monitor for any worsening of symptoms, chest pain, shortness of breath, wheezing, swelling of the throat, follow up for reevaluation.    For sore throat/cough try using a honey-based tea. Use 3 teaspoons of honey with juice squeezed from half lemon. Place shaved pieces of ginger into 1/2-1 cup of water and warm over stove top. Then mix the ingredients and repeat every 4 hours as needed.

## 2017-10-27 NOTE — ED Provider Notes (Signed)
[facility]    CSN: [id number] Arrival date & time: 10/27/17  1159     History   Chief Complaint Chief Complaint  Patient presents with  . Cough    HPI Jackson Montoya is a 32 y.o. male.   32 year old male with history of autism, nonverbal comes in with family member for 3-day history of URI symptoms.  Father states he has had nonproductive cough, nasal congestion, rhinorrhea.  Denies pulling of the ears.  Denies fever, chills, night sweats.  He has been eating and drinking without difficulty. Family member states has not been acting different than normal.  Has been taking OTC cold medication with mild relief.  Grandmother is also sick.     Past Medical History:  Diagnosis Date  . Autism     Patient Active Problem List   Diagnosis Date Noted  . Dental caries 01/25/2012    Past Surgical History:  Procedure Laterality Date  . NO PAST SURGERIES     no previous surgery  . TOOTH EXTRACTION  01/25/2012   Procedure: EXTRACTION MOLARS;  Surgeon: Francene Finders, DDS;  Location: [facility] OR;  Service: Oral Surgery;  Laterality: Bilateral;  Extraction of teeth 1,2,3,16,17,18,30,31,32       Home Medications    Prior to Admission medications   Medication Sig Start Date End Date Taking? Authorizing Provider  benzonatate (TESSALON) 100 MG capsule Take 1 capsule (100 mg total) by mouth every 8 (eight) hours. 10/27/17   Cathie Hoops, Amy V, PA-C  cetirizine (ZYRTEC) 10 MG tablet Take 1 tablet (10 mg total) by mouth daily. 10/27/17   Belinda Fisher, PA-C    Family History History reviewed. No pertinent family history.  Social History Social History   Tobacco Use  . Smoking status: Never Smoker  . Smokeless tobacco: Never Used  Substance Use Topics  . Alcohol use: No  . Drug use: No     Allergies   Patient has no known allergies.   Review of Systems Review of Systems  Reason unable to perform ROS: See HPI as above.     Physical Exam Triage Vital Signs ED Triage  Vitals [10/27/17 1250]  Enc Vitals Group     BP 139/89     Pulse Rate 90     Resp 20     Temp 98.1 F (36.7 C)     Temp Source Temporal     SpO2 98 %     Weight      Height      Head Circumference      Peak Flow      Pain Score 0     Pain Loc      Pain Edu?      Excl. in GC?    No data found.  Updated Vital Signs BP 139/89 (BP Location: Left Arm)   Pulse 90   Temp 98.1 F (36.7 C) (Temporal)   Resp 20   SpO2 98%   Physical Exam  Constitutional: He is oriented to person, place, and time. He appears well-developed and well-nourished. No distress.  HENT:  Head: Normocephalic and atraumatic.  Right Ear: Tympanic membrane, external ear and ear canal normal. Tympanic membrane is not erythematous and not bulging.  Left Ear: Tympanic membrane, external ear and ear canal normal. Tympanic membrane is not erythematous and not bulging.  Nose: Right sinus exhibits no maxillary sinus tenderness and no frontal sinus tenderness. Left sinus exhibits no maxillary sinus tenderness and no frontal sinus tenderness.  Mouth/Throat: Uvula is midline, oropharynx is clear and moist and mucous membranes are normal.  Unable to examine the nostril given patient's cooperation.  Eyes: Pupils are equal, round, and reactive to light. Conjunctivae are normal.  Neck: Normal range of motion. Neck supple.  Cardiovascular: Normal rate, regular rhythm and normal heart sounds. Exam reveals no gallop and no friction rub.  No murmur heard. Pulmonary/Chest: Effort normal and breath sounds normal. He has no decreased breath sounds. He has no wheezes. He has no rhonchi. He has no rales.  Lymphadenopathy:    He has no cervical adenopathy.  Neurological: He is alert and oriented to person, place, and time.  Skin: Skin is warm and dry.  Psychiatric: He has a normal mood and affect. His behavior is normal. Judgment normal.     UC Treatments / Results  Labs (all labs ordered are listed, but only abnormal results  are displayed) Labs Reviewed - No data to display  EKG None  Radiology No results found.  Procedures Procedures (including critical care time)  Medications Ordered in UC Medications - No data to display  Initial Impression / Assessment and Plan / UC Course  I have reviewed the triage vital signs and the nursing notes.  Pertinent labs & imaging results that were available during my care of the patient were reviewed by me and considered in my medical decision making (see chart for details).    Discussed with family member history and exam most consistent with viral URI. Symptomatic treatment as needed. Push fluids. Return precautions given.   Final Clinical Impressions(s) / UC Diagnoses   Final diagnoses:  Viral illness    ED Prescriptions    Medication Sig Dispense Auth. Provider   benzonatate (TESSALON) 100 MG capsule Take 1 capsule (100 mg total) by mouth every 8 (eight) hours. 21 capsule Yu, Amy V, PA-C   cetirizine (ZYRTEC) 10 MG tablet Take 1 tablet (10 mg total) by mouth daily. 15 tablet Threasa AlphaYu, Amy V, PA-C        Yu, Amy V, [place]PA-C 10/27/17 1410

## 2017-10-27 NOTE — ED Triage Notes (Signed)
Pt presents with ongoing cough 

## 2018-10-20 ENCOUNTER — Emergency Department (HOSPITAL_COMMUNITY): Payer: Medicare Other

## 2018-10-20 ENCOUNTER — Inpatient Hospital Stay (HOSPITAL_COMMUNITY)
Admission: EM | Admit: 2018-10-20 | Discharge: 2018-10-21 | DRG: 177 | Disposition: A | Discharge status: Home / Self Care | Payer: Medicare Other | Attending: Family Medicine | Admitting: Family Medicine

## 2018-10-20 ENCOUNTER — Other Ambulatory Visit: Payer: Self-pay

## 2018-10-20 ENCOUNTER — Encounter (HOSPITAL_COMMUNITY): Payer: Self-pay | Admitting: Emergency Medicine

## 2018-10-20 DIAGNOSIS — R05 Cough: Secondary | ICD-10-CM | POA: Diagnosis not present

## 2018-10-20 DIAGNOSIS — Z87892 Personal history of anaphylaxis: Secondary | ICD-10-CM | POA: Diagnosis not present

## 2018-10-20 DIAGNOSIS — R0902 Hypoxemia: Secondary | ICD-10-CM | POA: Diagnosis not present

## 2018-10-20 DIAGNOSIS — Z800 Family history of malignant neoplasm of digestive organs: Secondary | ICD-10-CM

## 2018-10-20 DIAGNOSIS — Z888 Allergy status to other drugs, medicaments and biological substances status: Secondary | ICD-10-CM | POA: Diagnosis not present

## 2018-10-20 DIAGNOSIS — U071 COVID-19: Principal | ICD-10-CM | POA: Diagnosis present

## 2018-10-20 DIAGNOSIS — J9601 Acute respiratory failure with hypoxia: Secondary | ICD-10-CM | POA: Diagnosis present

## 2018-10-20 DIAGNOSIS — F840 Autistic disorder: Secondary | ICD-10-CM | POA: Diagnosis present

## 2018-10-20 DIAGNOSIS — J988 Other specified respiratory disorders: Secondary | ICD-10-CM

## 2018-10-20 DIAGNOSIS — K08409 Partial loss of teeth, unspecified cause, unspecified class: Secondary | ICD-10-CM | POA: Diagnosis present

## 2018-10-20 DIAGNOSIS — Z209 Contact with and (suspected) exposure to unspecified communicable disease: Secondary | ICD-10-CM | POA: Diagnosis not present

## 2018-10-20 DIAGNOSIS — Z6841 Body Mass Index (BMI) 40.0 and over, adult: Secondary | ICD-10-CM | POA: Diagnosis not present

## 2018-10-20 DIAGNOSIS — J1289 Other viral pneumonia: Secondary | ICD-10-CM | POA: Diagnosis present

## 2018-10-20 DIAGNOSIS — T8051XS Anaphylactic reaction due to administration of blood and blood products, sequela: Secondary | ICD-10-CM | POA: Diagnosis not present

## 2018-10-20 DIAGNOSIS — Z79899 Other long term (current) drug therapy: Secondary | ICD-10-CM

## 2018-10-20 DIAGNOSIS — N179 Acute kidney failure, unspecified: Secondary | ICD-10-CM | POA: Diagnosis present

## 2018-10-20 DIAGNOSIS — J1282 Pneumonia due to coronavirus disease 2019: Secondary | ICD-10-CM | POA: Diagnosis present

## 2018-10-20 LAB — ABO/RH: ABO/RH(D): B POS

## 2018-10-20 LAB — PROCALCITONIN: Procalcitonin: <0.1 ng/mL

## 2018-10-20 LAB — CBC WITH DIFFERENTIAL/PLATELET
Abs Immature Granulocytes: 0.03 10*3/uL (ref 0.00–0.07)
Basophils Absolute: 0 10*3/uL (ref 0.0–0.1)
Basophils Relative: 0 %
Eosinophils Absolute: 0 10*3/uL (ref 0.0–0.5)
Eosinophils Relative: 0 %
HCT: 47.1 % (ref 39.0–52.0)
Hemoglobin: 14.6 g/dL (ref 13.0–17.0)
Immature Granulocytes: 1 %
Lymphocytes Relative: 18 %
Lymphs Abs: 1 10*3/uL (ref 0.7–4.0)
MCH: 25 pg — ABNORMAL LOW (ref 26.0–34.0)
MCHC: 31 g/dL (ref 30.0–36.0)
MCV: 80.5 fL (ref 80.0–100.0)
Monocytes Absolute: 0.2 10*3/uL (ref 0.1–1.0)
Monocytes Relative: 3 %
Neutro Abs: 4.4 10*3/uL (ref 1.7–7.7)
Neutrophils Relative %: 78 %
Platelets: 166 10*3/uL (ref 150–400)
RBC: 5.85 MIL/uL — ABNORMAL HIGH (ref 4.22–5.81)
RDW: 13.2 % (ref 11.5–15.5)
WBC: 5.6 10*3/uL (ref 4.0–10.5)
nRBC: 0 % (ref 0.0–0.2)

## 2018-10-20 LAB — BASIC METABOLIC PANEL
Anion gap: 12 (ref 5–15)
BUN: 15 mg/dL (ref 6–20)
CO2: 22 mmol/L (ref 22–32)
Calcium: 8.2 mg/dL — ABNORMAL LOW (ref 8.9–10.3)
Chloride: 100 mmol/L (ref 98–111)
Creatinine, Ser: 1.53 mg/dL — ABNORMAL HIGH (ref 0.61–1.24)
GFR calc Af Amer: >60 mL/min (ref >60)
GFR calc non Af Amer: 59 mL/min — ABNORMAL LOW (ref >60)
Glucose, Bld: 120 mg/dL — ABNORMAL HIGH (ref 70–99)
Potassium: 3.6 mmol/L (ref 3.5–5.1)
Sodium: 134 mmol/L — ABNORMAL LOW (ref 135–145)

## 2018-10-20 LAB — SARS CORONAVIRUS 2 BY RT PCR (HOSPITAL ORDER, PERFORMED IN ~~LOC~~ HOSPITAL LAB): SARS Coronavirus 2: POSITIVE — AB

## 2018-10-20 LAB — FIBRINOGEN: Fibrinogen: 689 mg/dL — ABNORMAL HIGH (ref 210–475)

## 2018-10-20 LAB — GLUCOSE, CAPILLARY: Glucose-Capillary: 124 mg/dL — ABNORMAL HIGH (ref 70–99)

## 2018-10-20 LAB — D-DIMER, QUANTITATIVE: D-Dimer, Quant: 1.28 ug/mL-FEU — ABNORMAL HIGH (ref 0.00–0.50)

## 2018-10-20 LAB — TROPONIN I (HIGH SENSITIVITY): Troponin I (High Sensitivity): 13 ng/L (ref <18)

## 2018-10-20 LAB — SEDIMENTATION RATE: Sed Rate: 18 mm/hr — ABNORMAL HIGH (ref 0–16)

## 2018-10-20 LAB — LACTATE DEHYDROGENASE: LDH: 1071 U/L — ABNORMAL HIGH (ref 98–192)

## 2018-10-20 LAB — FERRITIN: Ferritin: 1639 ng/mL — ABNORMAL HIGH (ref 24–336)

## 2018-10-20 LAB — C-REACTIVE PROTEIN: CRP: 4.7 mg/dL — ABNORMAL HIGH (ref <1.0)

## 2018-10-20 LAB — BRAIN NATRIURETIC PEPTIDE: B Natriuretic Peptide: 6.5 pg/mL (ref 0.0–100.0)

## 2018-10-20 LAB — TYPE AND SCREEN
ABO/RH(D): B POS
Antibody Screen: NEGATIVE

## 2018-10-20 LAB — LACTIC ACID, PLASMA: Lactic Acid, Venous: 1.3 mmol/L (ref 0.5–1.9)

## 2018-10-20 MED ORDER — GUAIFENESIN-DM 100-10 MG/5ML PO SYRP: 10.0000 mL | ORAL_SOLUTION | ORAL | Status: DC | PRN

## 2018-10-20 MED ORDER — VITAMIN C 500 MG PO TABS
500.0000 mg | ORAL_TABLET | Freq: Every day | ORAL | Status: DC
  Administered 2018-10-20 – 2018-10-21 (×2): 500 mg via ORAL
  Filled 2018-10-20 (×2): qty 1

## 2018-10-20 MED ORDER — ACETAMINOPHEN 325 MG PO TABS
650.0000 mg | ORAL_TABLET | Freq: Four times a day (QID) | ORAL | Status: DC | PRN
  Administered 2018-10-20 – 2018-10-21 (×2): 650 mg via ORAL
  Filled 2018-10-20 (×2): qty 2

## 2018-10-20 MED ORDER — ALBUTEROL SULFATE HFA 108 (90 BASE) MCG/ACT IN AERS
2.0000 | INHALATION_SPRAY | Freq: Four times a day (QID) | RESPIRATORY_TRACT | Status: DC
  Administered 2018-10-20 – 2018-10-21 (×3): 2 via RESPIRATORY_TRACT
  Filled 2018-10-20: qty 6.7

## 2018-10-20 MED ORDER — ONDANSETRON HCL 4 MG PO TABS: 4.0000 mg | ORAL_TABLET | Freq: Four times a day (QID) | ORAL | Status: DC | PRN

## 2018-10-20 MED ORDER — HYDROCOD POLST-CPM POLST ER 10-8 MG/5ML PO SUER: 5.0000 mL | Freq: Two times a day (BID) | ORAL | Status: DC | PRN

## 2018-10-20 MED ORDER — ENOXAPARIN SODIUM 40 MG/0.4ML ~~LOC~~ SOLN
40.0000 mg | SUBCUTANEOUS | Status: DC
  Administered 2018-10-20: 40 mg via SUBCUTANEOUS
  Filled 2018-10-20: qty 0.4

## 2018-10-20 MED ORDER — ONDANSETRON HCL 4 MG/2ML IJ SOLN: 4.0000 mg | Freq: Four times a day (QID) | INTRAMUSCULAR | Status: DC | PRN

## 2018-10-20 MED ORDER — ZINC SULFATE 220 (50 ZN) MG PO CAPS
220.0000 mg | ORAL_CAPSULE | Freq: Every day | ORAL | Status: DC
  Administered 2018-10-20 – 2018-10-21 (×2): 220 mg via ORAL
  Filled 2018-10-20 (×2): qty 1

## 2018-10-20 MED ORDER — PREDNISONE 20 MG PO TABS: 50.0000 mg | ORAL_TABLET | Freq: Every day | ORAL | Status: DC

## 2018-10-20 MED ORDER — ACETAMINOPHEN 325 MG PO TABS
650.0000 mg | ORAL_TABLET | Freq: Once | ORAL | Status: DC
  Filled 2018-10-20 (×2): qty 2

## 2018-10-20 MED ORDER — SODIUM CHLORIDE 0.9 % IV SOLN: INTRAVENOUS | Status: DC

## 2018-10-20 NOTE — ED Notes (Signed)
Pt O2 stats remained at 88%. Pt placed on 2lpm O2 by N/C O2 stats come up to 92%

## 2018-10-20 NOTE — ED Triage Notes (Signed)
Patient is autistic, nonverbal.  Patient family members have tested positive for Covid.  Patient has shortness of breath, pale, diaphoretic.  Patient was 89% on RA with [facility].  Patient placed on 2L via [place] and sats increased to 95%.

## 2018-10-20 NOTE — H&P (Addendum)
History and Physical    Jackson ShileySaraone Deloria ZOX:[id number] DOB: 08/04/1985 DOA: 10/20/2018  Referring MD/NP/PA: Baldemar FridaySteven Kohut PCP: System, Provider Not In  Patient coming from: home  Chief Complaint: Abdominal pain  I have personally briefly reviewed [facility]   HPI: Jackson Montoya is a 33 y.o. male with medical history significant of autism, nonverbal at baseline, and obesity; who presents with complaints of abdominal pain and poor appetite.  History is obtained from the patient's father who is present and being admitted into the hospital as well for COVID-19.  Patient reportedly had been having a decreased appetite and refusing to eat anything even his favorite foods yesterday.  He also noted to have complaints of abdominal pain.  ED Course: Upon admission into the emergency department patient was noted to be afebrile, pulse 109,  O2 saturations as low as 88% on room air improved with 2 L of nasal cannula oxygen, and all other vital signs maintained at this time.  Labs revealed WBC 5.6, sodium 134, BUN 15, and creatinine 1.53.  Chest x-ray showed bilateral pneumonia.  TRH called to admit.  Review of Systems  Unable to perform ROS: Patient nonverbal  Constitutional: Positive for malaise/fatigue.  Gastrointestinal: Positive for abdominal pain.    Past Medical History:  Diagnosis Date  . Autism     Past Surgical History:  Procedure Laterality Date  . TOOTH EXTRACTION  01/25/2012   Procedure: EXTRACTION MOLARS;  Surgeon: Francene Findershristopher L Whigham, DDS;  [facility];  Service: Oral Surgery;  Laterality: Bilateral;  Extraction of teeth 1,2,3,16,17,18,30,31,32     reports that he has never smoked. He has never used smokeless tobacco. He reports that he does not drink alcohol or use drugs.  No Known Allergies  Family History  Problem Relation Age of Onset  . Colon cancer Father     Prior to Admission medications   Medication Sig Start Date End Date  Taking? Authorizing Provider  acetaminophen (TYLENOL) 325 MG tablet Take 650 mg by mouth every 6 (six) hours as needed for mild pain or fever.   Yes [provider]  benzonatate (TESSALON) 100 MG capsule Take 1 capsule (100 mg total) by mouth every 8 (eight) hours. 10/27/17  Yes Yu, Amy V, PA-C  cetirizine (ZYRTEC) 10 MG tablet Take 1 tablet (10 mg total) by mouth daily. 10/27/17  Yes Belinda FisherYu, Amy V, PA-C    Physical Exam:  Constitutional: Obese male male in NAD, calm, comfortable Vitals:   10/20/18 0644  BP: 118/72  Pulse: (!) 109  Resp: 20  Temp: 98.2 F (36.8 C)  TempSrc: Oral  SpO2: (!) 88%   Eyes: PERRL, lids and conjunctivae normal ENMT: Mucous membranes are dry.  Posterior pharynx clear of any exudate or lesions.  Neck: normal, supple, no masses, no thyromegaly Respiratory: Mildly tachypneic with no significant wheezes or rhonchi appreciated. Cardiovascular: Tachycardic, no murmurs / rubs / gallops. No extremity edema. 2+ pedal pulses. No carotid bruits.  Abdomen: no tenderness, no masses palpated. No hepatosplenomegaly. Bowel sounds positive.  Musculoskeletal: no clubbing / cyanosis. No joint deformity upper and lower extremities. Good ROM, no contractures. Normal muscle tone.  Skin: no rashes, lesions, ulcers. No induration Neurologic: CN 2-12 grossly intact. Sensation intact, DTR normal. Strength 5/5 in all 4.  Psychiatric: Normal judgment and insight. Alert and oriented x 3. Normal mood.     Labs on Admission: I have personally reviewed following labs and imaging studies  CBC: Recent Labs  Lab 10/20/18 0718  WBC 5.6  NEUTROABS PENDING  HGB 14.6  HCT 47.1  MCV 80.5  PLT 166   Basic Metabolic Panel: Recent Labs  Lab 10/20/18 0718  NA 134*  K 3.6  CL 100  CO2 22  GLUCOSE 120*  BUN 15  CREATININE 1.53*  CALCIUM 8.2*   GFR: CrCl cannot be calculated (Unknown ideal weight.). Liver Function Tests: No results for input(s): AST, ALT, ALKPHOS,  BILITOT, PROT, ALBUMIN in the last 168 hours. No results for input(s): LIPASE, AMYLASE in the last 168 hours. No results for input(s): AMMONIA in the last 168 hours. Coagulation Profile: No results for input(s): INR, PROTIME in the last 168 hours. Cardiac Enzymes: No results for input(s): CKTOTAL, CKMB, CKMBINDEX, TROPONINI in the last 168 hours. BNP (last 3 results) No results for input(s): PROBNP in the last 8760 hours. HbA1C: No results for input(s): HGBA1C in the last 72 hours. CBG: No results for input(s): GLUCAP in the last 168 hours. Lipid Profile: No results for input(s): CHOL, HDL, LDLCALC, TRIG, CHOLHDL, LDLDIRECT in the last 72 hours. Thyroid Function Tests: No results for input(s): TSH, T4TOTAL, FREET4, T3FREE, THYROIDAB in the last 72 hours. Anemia Panel: No results for input(s): VITAMINB12, FOLATE, FERRITIN, TIBC, IRON, RETICCTPCT in the last 72 hours. Urine analysis:    Component Value Date/Time   COLORURINE YELLOW 05/08/2011 2354   APPEARANCEUR CLEAR 05/08/2011 2354   LABSPEC 1.002 (L) 05/08/2011 2354   PHURINE 6.5 05/08/2011 2354   GLUCOSEU NEGATIVE 05/08/2011 2354   HGBUR TRACE (A) 05/08/2011 2354   BILIRUBINUR NEGATIVE 05/08/2011 2354   KETONESUR NEGATIVE 05/08/2011 2354   PROTEINUR NEGATIVE 05/08/2011 2354   UROBILINOGEN 0.2 05/08/2011 2354   NITRITE NEGATIVE 05/08/2011 2354   LEUKOCYTESUR NEGATIVE 05/08/2011 2354   Sepsis Labs: Recent Results (from the past 240 hour(s))  SARS Coronavirus 2 (CEPHEID- Performed in [facility] lab), Hosp Order     Status: Abnormal   Collection Time: 10/20/18  7:14 AM   Specimen: Nasopharyngeal Swab  Result Value Ref Range Status   SARS Coronavirus 2 POSITIVE (A) NEGATIVE Final    Comment: CRITICAL RESULT CALLED TO, READ BACK BY AND VERIFIED WITH: RN SCOTT MCCORD 0825 [id number] FCP (NOTE) If result is NEGATIVE SARS-CoV-2 target nucleic acids are NOT DETECTED. The SARS-CoV-2 RNA is generally detectable in upper and  lower  respiratory specimens during the acute phase of infection. The lowest  concentration of SARS-CoV-2 viral copies this assay can detect is 250  copies / mL. A negative result does not preclude SARS-CoV-2 infection  and should not be used as the sole basis for treatment or other  patient management decisions.  A negative result may occur with  improper specimen collection / handling, submission of specimen other  than nasopharyngeal swab, presence of viral mutation(s) within the  areas targeted by this assay, and inadequate number of viral copies  (<250 copies / mL). A negative result must be combined with clinical  observations, patient history, and epidemiological information. If result is POSITIVE SARS-CoV-2 target nucleic acids are DETECTE D. The SARS-CoV-2 RNA is generally detectable in upper and lower  respiratory specimens during the acute phase of infection.  Positive  results are indicative of active infection with SARS-CoV-2.  Clinical  correlation with patient history and other diagnostic information is  necessary to determine patient infection status.  Positive results do  not rule out bacterial infection or co-infection with other viruses. If result is PRESUMPTIVE POSTIVE SARS-CoV-2 nucleic acids MAY  BE PRESENT.   A presumptive positive result was obtained on the submitted specimen  and confirmed on repeat testing.  While 2019 novel coronavirus  (SARS-CoV-2) nucleic acids may be present in the submitted sample  additional confirmatory testing may be necessary for epidemiological  and / or clinical management purposes  to differentiate between  SARS-CoV-2 and other Sarbecovirus currently known to infect humans.  If clinically indicated additional testing with an alternate test  methodology (LAB745 3) is advised. The SARS-CoV-2 RNA is generally  detectable in upper and lower respiratory specimens during the acute  phase of infection. The expected result is Negative.  Fact Sheet for Patients:  StrictlyIdeas.no Fact Sheet for Healthcare Providers: BankingDealers.co.za This test is not yet approved or cleared by the [place] FDA and has been authorized for detection and/or diagnosis of SARS-CoV-2 by FDA under an Emergency Use Authorization (EUA).  This EUA will remain in effect (meaning this test can be used) for the duration of the COVID-19 declaration under Section 564(b)(1) of the Act, 21 U.S.C. section 360bbb-3(b)(1), unless the authorization is terminated or revoked sooner. Performed at [facility], [place] [address] [place], [place] [address]      Radiological Exams on Admission: Dg Chest Portable 1 View  Result Date: 10/20/2018 CLINICAL DATA:  Hypoxia.  COVID-19 positive EXAM: PORTABLE CHEST 1 VIEW COMPARISON:  None. FINDINGS: There is patchy airspace opacity in each lower lobe region, slightly more extensive on the left than on the right. There is a questionable small left pleural effusion. Heart is upper normal in size with pulmonary vascularity normal. No adenopathy. No bone lesions. IMPRESSION: Bibasilar airspace opacity, more on the left than on the right, felt to be indicative of multifocal pneumonia. Question minimal left pleural effusion. Heart upper normal in size. No evident adenopathy. Electronically Signed   By: Lowella Grip III M.D.   On: 10/20/2018 09:14    Chest X: Independently reviewed.  Multifocal pneumonia with opacities worse on the left lung field  Assessment/Plan Acute respiratory failure with hypoxia and pneumonia secondary to COVID-19 infection: Patient presents from home with complaints of abdominal pain found to be hypoxic on room air down to 88%.  Lactic acid 1.3.  Chest x-ray showing bibasilar opacities concerning for multifocal pneumonia.  Lactic acid was 1.3. -Admit to telemetry bed at [facility] -Continuous pulse oximetry with 2 L nasal cannula  oxygen to maintain O2 saturation greater than 90% -Follow-up blood cultures -Follow-up inflammatory markers -Albuterol inhaler every 6 hours -Antitussives as needed -Vitamins including calcium and zinc -Continue monitoring inflammatory markers daily  Suspect acute kidney injury: On admission creatinine 1.53.  Baseline kidney function unknown.  Suspect related with decreased p.o. intake reported per family. -Check urinalysis -Normal saline at 100 ml/hr -Recheck creatinine in a.m.  Abdominal pain: Suspect related with COVID in 19 infection as father reported complaints of abdominal discomfort with diarrhea last week no reports of diarrhea. -Monitor intake and output   Autism: At baseline patient is not verbal.    DVT prophylaxis: Lovenox Code Status: Full Family Communication: discussed plan with family Disposition Plan: Likely discharge home in 2 to 4 days Consults called: None Admission status: Inpatient  Norval Morton MD [facility] Pager [phone]   If 7PM-7AM, please contact night-coverage [email or web address] Password [facility]  10/20/2018, 9:29 AM

## 2018-10-20 NOTE — ED Provider Notes (Signed)
[facility] Provider Note   CSN: [id number] Arrival date & time: 10/20/18  [id number]     History   Chief Complaint Chief Complaint  Patient presents with  . Shortness of Breath    HPI Jackson Montoya is a 33 y.o. male.     HPI   33yM with dyspnea. Brought in by EMS. Pt has hx of autism and seems to be non-verbal. Family has yet to arrive to ED. He is awake and alert but noticeably tachypneic. O2 sats 89% for EMS. Reportedly family members recently tested positive for COVID-19.   Past Medical History:  Diagnosis Date  . Autism     Patient Active Problem List   Diagnosis Date Noted  . Dental caries 01/25/2012    Past Surgical History:  Procedure Laterality Date  . NO PAST SURGERIES     no previous surgery  . TOOTH EXTRACTION  01/25/2012   Procedure: EXTRACTION MOLARS;  Surgeon: Francene Findershristopher L Clayton, DDS;  [facility];  Service: Oral Surgery;  Laterality: Bilateral;  Extraction of teeth 1,2,3,16,17,18,30,31,32        Home Medications    Prior to Admission medications   Medication Sig Start Date End Date Taking? Authorizing Provider  benzonatate (TESSALON) 100 MG capsule Take 1 capsule (100 mg total) by mouth every 8 (eight) hours. 10/27/17   Cathie HoopsYu, Amy V, PA-C  cetirizine (ZYRTEC) 10 MG tablet Take 1 tablet (10 mg total) by mouth daily. 10/27/17   Belinda FisherYu, Amy V, PA-C    Family History No family history on file.  Social History Social History   Tobacco Use  . Smoking status: Never Smoker  . Smokeless tobacco: Never Used  Substance Use Topics  . Alcohol use: No  . Drug use: No     Allergies   Patient has no known allergies.   Review of Systems Review of Systems  Level 5 caveat because pt is nonverbal.   Physical Exam Updated Vital Signs BP 118/72 (BP Location: Right Arm)   Pulse (!) 109   Temp 98.2 F (36.8 C) (Oral)   Resp 20   SpO2 (!) 88%   Physical Exam Vitals signs and nursing note reviewed.   Constitutional:      General: He is not in acute distress.    Appearance: He is well-developed. He is obese.  HENT:     Head: Normocephalic and atraumatic.  Eyes:     General:        Right eye: No discharge.        Left eye: No discharge.     Conjunctiva/sclera: Conjunctivae normal.  Neck:     Musculoskeletal: Neck supple.  Cardiovascular:     Rate and Rhythm: Regular rhythm. Tachycardia present.     Heart sounds: Normal heart sounds. No murmur. No friction rub. No gallop.   Pulmonary:     Comments: Tachypnea, RR ~30.  Abdominal:     General: There is no distension.     Palpations: Abdomen is soft.     Tenderness: There is no abdominal tenderness.  Musculoskeletal:        General: No tenderness.  Skin:    General: Skin is warm and dry.  Neurological:     Mental Status: He is alert.      ED Treatments / Results  Labs (all labs ordered are listed, but only abnormal results are displayed) Labs Reviewed  SARS CORONAVIRUS 2 (HOSPITAL ORDER, [facility]FOR[facility]) - Abnormal; Notable  for the following components:      Result Value   SARS Coronavirus 2 POSITIVE (*)    All other components within normal limits  CBC WITH DIFFERENTIAL/PLATELET - Abnormal; Notable for the following components:   RBC 5.85 (*)    MCH 25.0 (*)    All other components within normal limits  BASIC METABOLIC PANEL - Abnormal; Notable for the following components:   Sodium 134 (*)    Glucose, Bld 120 (*)    Creatinine, Ser 1.53 (*)    Calcium 8.2 (*)    GFR calc non Af Amer 59 (*)    All other components within normal limits  C-REACTIVE PROTEIN - Abnormal; Notable for the following components:   CRP 4.7 (*)    All other components within normal limits  D-DIMER, QUANTITATIVE (NOT AT [facility]) - Abnormal; Notable for the following components:   D-Dimer, Quant 1.28 (*)    All other components within normal limits  FERRITIN - Abnormal; Notable for the following components:   Ferritin  1,639 (*)    All other components within normal limits  FIBRINOGEN - Abnormal; Notable for the following components:   Fibrinogen 689 (*)    All other components within normal limits  SEDIMENTATION RATE - Abnormal; Notable for the following components:   Sed Rate 18 (*)    All other components within normal limits  LACTATE DEHYDROGENASE - Abnormal; Notable for the following components:   LDH 1,071 (*)    All other components within normal limits  CBC WITH DIFFERENTIAL/PLATELET - Abnormal; Notable for the following components:   MCH 24.6 (*)    All other components within normal limits  COMPREHENSIVE METABOLIC PANEL - Abnormal; Notable for the following components:   Glucose, Bld 112 (*)    BUN 22 (*)    Calcium 8.1 (*)    Albumin 3.3 (*)    AST 224 (*)    ALT 98 (*)    Alkaline Phosphatase 24 (*)    All other components within normal limits  C-REACTIVE PROTEIN - Abnormal; Notable for the following components:   CRP 5.7 (*)    All other components within normal limits  CK - Abnormal; Notable for the following components:   Total CK 900 (*)    All other components within normal limits  D-DIMER, QUANTITATIVE (NOT AT [facility]) - Abnormal; Notable for the following components:   D-Dimer, Quant 0.86 (*)    All other components within normal limits  FERRITIN - Abnormal; Notable for the following components:   Ferritin 1,284 (*)    All other components within normal limits  INTERLEUKIN-6, PLASMA - Abnormal; Notable for the following components:   Interleukin-6, Plasma 70.0 (*)    All other components within normal limits  MAGNESIUM - Abnormal; Notable for the following components:   Magnesium 2.5 (*)    All other components within normal limits  TRIGLYCERIDES - Abnormal; Notable for the following components:   Triglycerides 155 (*)    All other components within normal limits  GLUCOSE, CAPILLARY - Abnormal; Notable for the following components:   Glucose-Capillary 124 (*)    All  other components within normal limits  INTERLEUKIN-6, PLASMA - Abnormal; Notable for the following components:   Interleukin-6, Plasma 38.2 (*)    All other components within normal limits  CULTURE, BLOOD (ROUTINE X 2)  LACTIC ACID, PLASMA  BRAIN NATRIURETIC PEPTIDE  HIV ANTIBODY (ROUTINE TESTING W REFLEX)  PROCALCITONIN  PHOSPHORUS  HEPATITIS B SURFACE ANTIGEN  TRIGLYCERIDES  HEPATITIS B SURFACE ANTIGEN  ABO/RH  TYPE AND SCREEN  TROPONIN I (HIGH SENSITIVITY)    EKG None  Radiology No results found.   Dg Chest Portable 1 View  Result Date: 10/20/2018 CLINICAL DATA:  Hypoxia.  COVID-19 positive EXAM: PORTABLE CHEST 1 VIEW COMPARISON:  None. FINDINGS: There is patchy airspace opacity in each lower lobe region, slightly more extensive on the left than on the right. There is a questionable small left pleural effusion. Heart is upper normal in size with pulmonary vascularity normal. No adenopathy. No bone lesions. IMPRESSION: Bibasilar airspace opacity, more on the left than on the right, felt to be indicative of multifocal pneumonia. Question minimal left pleural effusion. Heart upper normal in size. No evident adenopathy. Electronically Signed   By: Bretta BangWilliam  Woodruff III M.D.   On: 10/20/2018 09:14    Procedures Procedures (including critical care time)  CRITICAL CARE Performed by: Raeford RazorStephen Seif Teichert Total critical care time: 35 minutes Critical care time was exclusive of separately billable procedures and treating other patients. Critical care was necessary to treat or prevent imminent or life-threatening deterioration. Critical care was time spent personally by me on the following activities: development of treatment plan with patient and/or surrogate as well as nursing, discussions with consultants, evaluation of patient's response to treatment, examination of patient, obtaining history from patient or surrogate, ordering and performing treatments and interventions, ordering and  review of laboratory studies, ordering and review of radiographic studies, pulse oximetry and re-evaluation of patient's condition.  Medications Ordered in ED Medications  acetaminophen (TYLENOL) tablet 650 mg (has no administration in time range)     Initial Impression / Assessment and Plan / ED Course  I have reviewed the triage vital signs and the nursing notes.  Pertinent labs & imaging results that were available during my care of the patient were reviewed by me and considered in my medical decision making (see chart for details).    33yM with tachypnea, hypoxemia. Consider covid, bacterial pneumonia, aspiration pneumonitis, pe, chf, severe anemia, etc.   High likelihood of covid particularly with close contacts being positive. o2 sats in 90s with 2L o2 via [place]. Sounds diminished on exam w/o adventitious breath sounds. Plan cxr, labs, covid testing. With oxygen requirement will ultimately require admission.    BP currently fine and afebrile in ED. Will defer from abx at this time. Will continue to monitor and reassess exam and as studies return.   7:57 AM I spoke with pt's sister-in-law [phone]). Pt's father was [facility] with [facility] 2d ago and is back in the ER again currently. His mother is also [facility]. Family calls him "Uon" meaning "young." He is mostly nonverbal but will sometimes repeat back what is said to him and understands what is said to him more than he is seemingly able to communicate. The biggest change family has noticed is his refusal to eat since yesterday. He normally is a very good eater but has been refusing even his favorite foods.   Jackson ShileySaraone Kanitz was evaluated in Emergency Department on 10/20/2018 for the symptoms described in the history of present illness. He was evaluated in the context of the global COVID-19 pandemic, which necessitated consideration that the patient might be at risk for infection  with the SARS-CoV-2 virus that causes COVID-19. Institutional protocols and algorithms that pertain to the evaluation of patients at risk for COVID-19 are in a state of rapid change based on information released  by regulatory bodies including the CDC and federal and state organizations. These policies and algorithms were followed during the patient's care in the ED.   Final Clinical Impressions(s) / ED Diagnoses   Final diagnoses:  Respiratory tract infection due to COVID-19 virus    ED Discharge Orders    None       Virgel Manifold, MD 10/23/18 [phone]

## 2018-10-21 LAB — CBC WITH DIFFERENTIAL/PLATELET
Abs Immature Granulocytes: 0.03 10*3/uL (ref 0.00–0.07)
Basophils Absolute: 0 10*3/uL (ref 0.0–0.1)
Basophils Relative: 0 %
Eosinophils Absolute: 0 10*3/uL (ref 0.0–0.5)
Eosinophils Relative: 0 %
HCT: 45.6 % (ref 39.0–52.0)
Hemoglobin: 14 g/dL (ref 13.0–17.0)
Immature Granulocytes: 1 %
Lymphocytes Relative: 21 %
Lymphs Abs: 1.2 10*3/uL (ref 0.7–4.0)
MCH: 24.6 pg — ABNORMAL LOW (ref 26.0–34.0)
MCHC: 30.7 g/dL (ref 30.0–36.0)
MCV: 80.3 fL (ref 80.0–100.0)
Monocytes Absolute: 0.2 10*3/uL (ref 0.1–1.0)
Monocytes Relative: 3 %
Neutro Abs: 4.1 10*3/uL (ref 1.7–7.7)
Neutrophils Relative %: 75 %
Platelets: 193 10*3/uL (ref 150–400)
RBC: 5.68 MIL/uL (ref 4.22–5.81)
RDW: 13.3 % (ref 11.5–15.5)
WBC: 5.5 10*3/uL (ref 4.0–10.5)
nRBC: 0 % (ref 0.0–0.2)

## 2018-10-21 LAB — COMPREHENSIVE METABOLIC PANEL
ALT: 98 U/L — ABNORMAL HIGH (ref 0–44)
AST: 224 U/L — ABNORMAL HIGH (ref 15–41)
Albumin: 3.3 g/dL — ABNORMAL LOW (ref 3.5–5.0)
Alkaline Phosphatase: 24 U/L — ABNORMAL LOW (ref 38–126)
Anion gap: 12 (ref 5–15)
BUN: 22 mg/dL — ABNORMAL HIGH (ref 6–20)
CO2: 22 mmol/L (ref 22–32)
Calcium: 8.1 mg/dL — ABNORMAL LOW (ref 8.9–10.3)
Chloride: 103 mmol/L (ref 98–111)
Creatinine, Ser: 1.13 mg/dL (ref 0.61–1.24)
GFR calc Af Amer: >60 mL/min (ref >60)
GFR calc non Af Amer: >60 mL/min (ref >60)
Glucose, Bld: 112 mg/dL — ABNORMAL HIGH (ref 70–99)
Potassium: 3.6 mmol/L (ref 3.5–5.1)
Sodium: 137 mmol/L (ref 135–145)
Total Bilirubin: 0.8 mg/dL (ref 0.3–1.2)
Total Protein: 7.7 g/dL (ref 6.5–8.1)

## 2018-10-21 LAB — CK: Total CK: 900 U/L — ABNORMAL HIGH (ref 49–397)

## 2018-10-21 LAB — TRIGLYCERIDES
Triglycerides: 139 mg/dL (ref <150)
Triglycerides: 155 mg/dL — ABNORMAL HIGH (ref <150)

## 2018-10-21 LAB — C-REACTIVE PROTEIN: CRP: 5.7 mg/dL — ABNORMAL HIGH (ref <1.0)

## 2018-10-21 LAB — D-DIMER, QUANTITATIVE: D-Dimer, Quant: 0.86 ug/mL-FEU — ABNORMAL HIGH (ref 0.00–0.50)

## 2018-10-21 LAB — HIV ANTIBODY (ROUTINE TESTING W REFLEX): HIV Screen 4th Generation wRfx: NONREACTIVE

## 2018-10-21 LAB — MAGNESIUM: Magnesium: 2.5 mg/dL — ABNORMAL HIGH (ref 1.7–2.4)

## 2018-10-21 LAB — FERRITIN: Ferritin: 1284 ng/mL — ABNORMAL HIGH (ref 24–336)

## 2018-10-21 LAB — PHOSPHORUS: Phosphorus: 3.6 mg/dL (ref 2.5–4.6)

## 2018-10-21 MED ORDER — PREDNISONE 50 MG PO TABS: 50.0000 mg | ORAL_TABLET | Freq: Every day | ORAL | 0 refills | Status: AC

## 2018-10-21 MED ORDER — PREDNISONE 20 MG PO TABS
50.0000 mg | ORAL_TABLET | Freq: Every day | ORAL | Status: DC
  Administered 2018-10-21: 50 mg via ORAL
  Filled 2018-10-21: qty 1

## 2018-10-21 NOTE — Discharge Planning (Addendum)
Patient IV removed.  RN assessment revealed stability for DC to home.  Informed of how to care for self and others when DC home.  Discussed need for continuing to wear masksm remain in the home and clean surfaces with bleach.  Explained self monitoring for continued improvement and not getting worse.  Informed of the above with Earnest Bailey (daughter in law) with speaks English - since patient has AMS d/t Autism. Script sent to [facility], per patient request. Med care packet also sent home.  Once ready, will be wheeld to front and family transporting home via car.

## 2018-10-21 NOTE — Care Management (Signed)
Pt to discharge home today - brother will pick pt home up via private vehicle.  Pt is autistic and lives with his brother.  CM spoke with pts sister in law [place] - pt is independent with ADLs but is non verbal.  Family will purchase a thermometer for pt.  Pt has PCP and family denied barriers with paying for discharge medication.  No CM needs identified  - CM signing off

## 2018-10-22 LAB — INTERLEUKIN-6, PLASMA
Interleukin-6, Plasma: 38.2 pg/mL — ABNORMAL HIGH (ref 0.0–12.2)
Interleukin-6, Plasma: 70 pg/mL — ABNORMAL HIGH (ref 0.0–12.2)

## 2018-10-22 LAB — HEPATITIS B SURFACE ANTIGEN
Hepatitis B Surface Ag: NEGATIVE
Hepatitis B Surface Ag: NEGATIVE

## 2018-10-22 NOTE — Discharge Summary (Signed)
Physician Discharge Summary  Jackson Montoya ZOX:[id number] DOB: 02/09/1986 DOA: 10/20/2018  PCP: System, Provider Not In  Admit date: 10/20/2018 Discharge date: 10/22/2018  Admitted From: Home Disposition: Home   Recommendations for Outpatient Follow-up:  1. Follow up with PCP in 1-2 weeks. 2. Please obtain CMP/CBC in one week to follow up LFTs.  Home Health: None Equipment/Devices: None Discharge Condition: Stable CODE STATUS: Full Diet recommendation: As tolerated  Brief/Interim Summary: Jackson Montoya is a 33 y.o. male with a history of nonverbal autism and obesity with multiple covid-positive family members who presented with complaints of abdominal pain and poor appetite. He was found to be afebrile with tachycardia and SpO2 88% improved with 2L by nasal cannula. AKI was noted with creatinine 1.53 and CXR was noted to have bibasilar infiltrates. He presented with his father who was [facility] and on arrival found not to be hypoxic so remdesivir not administered. Steroids and IV fluids were given. Creatinine improved quickly. On the following day, hypoxia remained resolved, and the patient has regained an appetite. He has no respiratory complaints on the day of discharge, has been walking in the room breathing ambient air in no distress without hypoxia. His brother, who [facility] after hospitalization for cov[facility], has arranged a safe discharge for him.   Discharge Diagnoses:  Principal Problem:   Pneumonia due to COVID-19 virus Active Problems:   Acute respiratory failure with hypoxia (HCC)   Autism   Anaphylactic reaction due to admin blood/products, sequela   AKI (acute kidney injury) (HCC)  Acute hypoxemic respiratory failure due to covid pneumonia: Patient was found to be hypoxic in the ED though with steroids, this has resolved. It was absolutely reasonable to anticipate multiple days of inpatient management, specifically 5 days of IV remdesivir, though since  hypoxia has remained durably resolved, he is not a candidate for remdesivir without indication for tocilizumab, convalescent plasma, and is stable for discharge the day following admission. - Patient's autism reportedly made obtaining IV access difficult, so started on oral steroids. Having no inability to take po intake, has tolerated prednisone.   AKI: Resolved with fluids, per oral intake improving.  - Recheck at follow up  Abdominal pain: Resolved, benign exam. No diarrhea.   LFT elevation: Likely due to viral infection. No h/o hepatitis.  - Check hepatitis panel.  - Recheck at follow up.  Morbid obesity: BMI 42.  - Long term weight loss is recommended.  Severe autism: Noted, minimize interventions as able.  Discharge Instructions Discharge Instructions    Diet general   Complete by: As directed    Discharge instructions   Complete by: As directed    You are being discharged from the hospital after treatment for covid-19 infection. You are not requiring any supplemental oxygen, are in no respiratory distress, and can eat and drink normally.  - Continue taking prednisone once daily for 10 days to decrease inflammation associated with covid-19 infection.  - Do not take NSAID medications (including, but not limited to, ibuprofen, advil, motrin, naproxen, aleve, goody's powder, etc.) - Follow up with your doctor in the next week via telehealth or seek medical attention right away if your symptoms get WORSE.  - Consider donating plasma after you have recovered (either 14 days after a negative test or 28 days after symptoms have completely resolved) because your antibodies to this virus may be helpful to give to others with life-threatening infections. Please go to the website [email or web address] if you would like  to consider volunteering for plasma donation.    Directions for you at home:  Wear a facemask You should wear a facemask that covers your nose and mouth when you are in the same  room with other people and when you visit a healthcare provider. People who live with or visit you should also wear a facemask while they are in the same room with you.  Separate yourself from other people in your home As much as possible, you should stay in a different room from other people in your home. Also, you should use a separate bathroom, if available.  Avoid sharing household items You should not share dishes, drinking glasses, cups, eating utensils, towels, bedding, or other items with other people in your home. After using these items, you should wash them thoroughly with soap and water.  Cover your coughs and sneezes Cover your mouth and nose with a tissue when you cough or sneeze, or you can cough or sneeze into your sleeve. Throw used tissues in a lined trash can, and immediately wash your hands with soap and water for at least 20 seconds or use an alcohol-based hand rub.  Wash your [facility] your hands often and thoroughly with soap and water for at least 20 seconds. You can use an alcohol-based hand sanitizer if soap and water are not available and if your hands are not visibly dirty. Avoid touching your eyes, nose, and mouth with unwashed hands.  Directions for those who live with, or provide care at home for you:  Limit the number of people who have contact with the patient If possible, have only one caregiver for the patient. Other household members should stay in another home or place of residence. If this is not possible, they should stay in another room, or be separated from the patient as much as possible. Use a separate bathroom, if available. Restrict visitors who do not have an essential need to be in the home.  Ensure good ventilation Make sure that shared spaces in the home have good air flow, such as from an air conditioner or an opened window, weather permitting.  Wash your hands often Wash your hands often and thoroughly with soap and water for at  least 20 seconds. You can use an alcohol based hand sanitizer if soap and water are not available and if your hands are not visibly dirty. Avoid touching your eyes, nose, and mouth with unwashed hands. Use disposable paper towels to dry your hands. If not available, use dedicated cloth towels and replace them when they become wet.  Wear a facemask and gloves Wear a disposable facemask at all times in the room and gloves when you touch or have contact with the patient's blood, body fluids, and/or secretions or excretions, such as sweat, saliva, sputum, nasal mucus, vomit, urine, or feces.  Ensure the mask fits over your nose and mouth tightly, and do not touch it during use. Throw out disposable facemasks and gloves after using them. Do not reuse. Wash your hands immediately after removing your facemask and gloves. If your personal clothing becomes contaminated, carefully remove clothing and launder. Wash your hands after handling contaminated clothing. Place all used disposable facemasks, gloves, and other waste in a lined container before disposing them with other household waste. Remove gloves and wash your hands immediately after handling these items.  Do not share dishes, glasses, or other household items with the patient Avoid sharing household items. You should not share dishes, drinking glasses, cups,  eating utensils, towels, bedding, or other items with a patient who is confirmed to have, or being evaluated for, COVID-19 infection. After the person uses these items, you should wash them thoroughly with soap and water.  Wash laundry thoroughly Immediately remove and wash clothes or bedding that have blood, body fluids, and/or secretions or excretions, such as sweat, saliva, sputum, nasal mucus, vomit, urine, or feces, on them. Wear gloves when handling laundry from the patient. Read and follow directions on labels of laundry or clothing items and detergent. In general, wash and dry with the  warmest temperatures recommended on the label.  Clean all areas the individual has used often Clean all touchable surfaces, such as counters, tabletops, doorknobs, bathroom fixtures, toilets, phones, keyboards, tablets, and bedside tables, every day. Also, clean any surfaces that may have blood, body fluids, and/or secretions or excretions on them. Wear gloves when cleaning surfaces the patient has come in contact with. Use a diluted bleach solution (e.g., dilute bleach with 1 part bleach and 10 parts water) or a household disinfectant with a label that says EPA-registered for coronaviruses. To make a bleach solution at home, add 1 tablespoon of bleach to 1 quart (4 cups) of water. For a larger supply, add  cup of bleach to 1 gallon (16 cups) of water. Read labels of cleaning products and follow recommendations provided on product labels. Labels contain instructions for safe and effective use of the cleaning product including precautions you should take when applying the product, such as wearing gloves or eye protection and making sure you have good ventilation during use of the product. Remove gloves and wash hands immediately after cleaning.  Monitor yourself for signs and symptoms of illness Caregivers and household members are considered close contacts, should monitor their health, and will be asked to limit movement outside of the home to the extent possible. Follow the monitoring steps for close contacts listed on the symptom monitoring form.   If you have additional questions, contact your local health department or call the epidemiologist on call at [phone] (available 24/7). This guidance is subject to change. For the most up-to-date guidance from [facility], please refer to their website: [email or web address]   Increase activity slowly   Complete by: As directed      Allergies as of 10/21/2018   No Known Allergies     Medication  List    TAKE these medications   acetaminophen 325 MG tablet Commonly known as: TYLENOL Take 650 mg by mouth every 6 (six) hours as needed for mild pain or fever.   benzonatate 100 MG capsule Commonly known as: TESSALON Take 1 capsule (100 mg total) by mouth every 8 (eight) hours.   cetirizine 10 MG tablet Commonly known as: ZYRTEC Take 1 tablet (10 mg total) by mouth daily.   predniSONE 50 MG tablet Commonly known as: DELTASONE Take 1 tablet (50 mg total) by mouth daily with breakfast.       No Known Allergies  Consultations:  None  Procedures/Studies: Dg Chest Portable 1 View  Result Date: 10/20/2018 CLINICAL DATA:  Hypoxia.  COVID-19 positive EXAM: PORTABLE CHEST 1 VIEW COMPARISON:  None. FINDINGS: There is patchy airspace opacity in each lower lobe region, slightly more extensive on the left than on the right. There is a questionable small left pleural effusion. Heart is upper normal in size with pulmonary vascularity normal. No adenopathy. No bone lesions. IMPRESSION: Bibasilar airspace opacity, more on the left than on the right, felt to be indicative of  multifocal pneumonia. Question minimal left pleural effusion. Heart upper normal in size. No evident adenopathy. Electronically Signed   By: Bretta BangWilliam  Woodruff III M.D.   On: 10/20/2018 09:14   Subjective: No new events, mother at bedside reports he's had no breathing problems, eaten well since arrival. He is nonverbal.   Discharge Exam: Vitals:   10/20/18 2332 10/21/18 0520  BP: 117/87   Pulse: (!) 110   Resp: (!) 22 (!) 22  Temp: (!) 100.4 F (38 C) 98.8 F (37.1 C)  SpO2: 90%    General: Pt is alert, awake, not in acute distress Cardiovascular: RRR, S1/S2 +, no rubs, no gallops Respiratory: CTA bilaterally, no wheezing, no rhonchi Abdominal: Soft, NT, ND, bowel sounds + Extremities: No edema, no cyanosis  Labs: BNP (last 3 results) Recent Labs    10/20/18 0718  BNP 6.5   Basic Metabolic  Panel: Recent Labs  Lab 10/20/18 0718 10/21/18 0414  NA 134* 137  K 3.6 3.6  CL 100 103  CO2 22 22  GLUCOSE 120* 112*  BUN 15 22*  CREATININE 1.53* 1.13  CALCIUM 8.2* 8.1*  MG  --  2.5*  PHOS  --  3.6   Liver Function Tests: Recent Labs  Lab 10/21/18 0414  AST 224*  ALT 98*  ALKPHOS 24*  BILITOT 0.8  PROT 7.7  ALBUMIN 3.3*   No results for input(s): LIPASE, AMYLASE in the last 168 hours. No results for input(s): AMMONIA in the last 168 hours. CBC: Recent Labs  Lab 10/20/18 0718 10/21/18 0414  WBC 5.6 5.5  NEUTROABS 4.4 4.1  HGB 14.6 14.0  HCT 47.1 45.6  MCV 80.5 80.3  PLT 166 193   Cardiac Enzymes: Recent Labs  Lab 10/21/18 0414  CKTOTAL 900*   BNP: Invalid input(s): POCBNP CBG: Recent Labs  Lab 10/20/18 1748  GLUCAP 124*   D-Dimer Recent Labs    10/20/18 1021 10/21/18 0414  DDIMER 1.28* 0.86*   Hgb A1c No results for input(s): HGBA1C in the last 72 hours. Lipid Profile Recent Labs    10/20/18 1021 10/21/18 0414  TRIG 139 155*   Thyroid function studies No results for input(s): TSH, T4TOTAL, T3FREE, THYROIDAB in the last 72 hours.  Invalid input(s): FREET3 Anemia work up Recent Labs    10/20/18 1021 10/21/18 0414  FERRITIN 1,639* 1,284*   Urinalysis    Component Value Date/Time   COLORURINE YELLOW 05/08/2011 2354   APPEARANCEUR CLEAR 05/08/2011 2354   LABSPEC 1.002 (L) 05/08/2011 2354   PHURINE 6.5 05/08/2011 2354   GLUCOSEU NEGATIVE 05/08/2011 2354   HGBUR TRACE (A) 05/08/2011 2354   BILIRUBINUR NEGATIVE 05/08/2011 2354   KETONESUR NEGATIVE 05/08/2011 2354   PROTEINUR NEGATIVE 05/08/2011 2354   UROBILINOGEN 0.2 05/08/2011 2354   NITRITE NEGATIVE 05/08/2011 2354   LEUKOCYTESUR NEGATIVE 05/08/2011 2354    Microbiology Recent Results (from the past 240 hour(s))  SARS Coronavirus 2 (CEPHEID- Performed in [facility]), Hosp Order     Status: Abnormal   Collection Time: 10/20/18  7:14 AM   Specimen:  Nasopharyngeal Swab  Result Value Ref Range Status   SARS Coronavirus 2 POSITIVE (A) NEGATIVE Final    Comment: CRITICAL RESULT CALLED TO, READ BACK BY AND VERIFIED WITH: RN SCOTT MCCORD [id number] FCP (NOTE) If result is NEGATIVE SARS-CoV-2 target nucleic acids are NOT DETECTED. The SARS-CoV-2 RNA is generally detectable in upper and lower  respiratory specimens during the acute phase of infection. The lowest  concentration of  SARS-CoV-2 viral copies this assay can detect is 250  copies / mL. A negative result does not preclude SARS-CoV-2 infection  and should not be used as the sole basis for treatment or other  patient management decisions.  A negative result may occur with  improper specimen collection / handling, submission of specimen other  than nasopharyngeal swab, presence of viral mutation(s) within the  areas targeted by this assay, and inadequate number of viral copies  (<250 copies / mL). A negative result must be combined with clinical  observations, patient history, and epidemiological information. If result is POSITIVE SARS-CoV-2 target nucleic acids are DETECTE D. The SARS-CoV-2 RNA is generally detectable in upper and lower  respiratory specimens during the acute phase of infection.  Positive  results are indicative of active infection with SARS-CoV-2.  Clinical  correlation with patient history and other diagnostic information is  necessary to determine patient infection status.  Positive results do  not rule out bacterial infection or co-infection with other viruses. If result is PRESUMPTIVE POSTIVE SARS-CoV-2 nucleic acids MAY BE PRESENT.   A presumptive positive result was obtained on the submitted specimen  and confirmed on repeat testing.  While 2019 novel coronavirus  (SARS-CoV-2) nucleic acids may be present in the submitted sample  additional confirmatory testing may be necessary for epidemiological  and / or clinical management purposes  to differentiate  between  SARS-CoV-2 and other Sarbecovirus currently known to infect humans.  If clinically indicated additional testing with an alternate test  methodology (LAB745 3) is advised. The SARS-CoV-2 RNA is generally  detectable in upper and lower respiratory specimens during the acute  phase of infection. The expected result is Negative. Fact Sheet for Patients:  BoilerBrush.com.cyhttps://www.fda.gov/media/136312/download Fact Sheet for Healthcare Providers: https://pope.com/https://www.fda.gov/media/136313/download This test is not yet approved or cleared by the [place] FDA and has been authorized for detection and/or diagnosis of SARS-CoV-2 by FDA under an Emergency Use Authorization (EUA).  This EUA will remain in effect (meaning this test can be used) for the duration of the COVID-19 declaration under Section 564(b)(1) of the Act, 21 U.S.C. section 360bbb-3(b)(1), unless the authorization is terminated or revoked sooner. Performed at [facility], [address] [place], [place] [address]   Blood culture (routine x 2)     Status: None (Preliminary result)   Collection Time: 10/20/18  8:41 AM   Specimen: BLOOD  Result Value Ref Range Status   Specimen Description BLOOD LEFT ANTECUBITAL  Final   Special Requests   Final    BOTTLES DRAWN AEROBIC AND ANAEROBIC Blood Culture adequate volume   Culture   Final    NO GROWTH 2 DAYS Performed at [facility], [address] [place], [place] [address]    Report Status PENDING  Incomplete    Time coordinating discharge: Approximately 40 minutes  Tyrone Nineyan B Ashlon Lottman, MD  [facility] 10/22/2018, 4:32 PM

## 2018-10-25 LAB — CULTURE, BLOOD (ROUTINE X 2)
Culture: NO GROWTH
Special Requests: ADEQUATE

## 2019-06-28 DIAGNOSIS — R0600 Dyspnea, unspecified: Secondary | ICD-10-CM | POA: Diagnosis not present

## 2021-02-14 IMAGING — DX PORTABLE CHEST - 1 VIEW
1 series · 1 of 1 positions shown · non-contrast
Comparison: None.

CLINICAL DATA: Hypoxia.  [id number] positive

EXAM:
PORTABLE CHEST 1 VIEW

[chest]
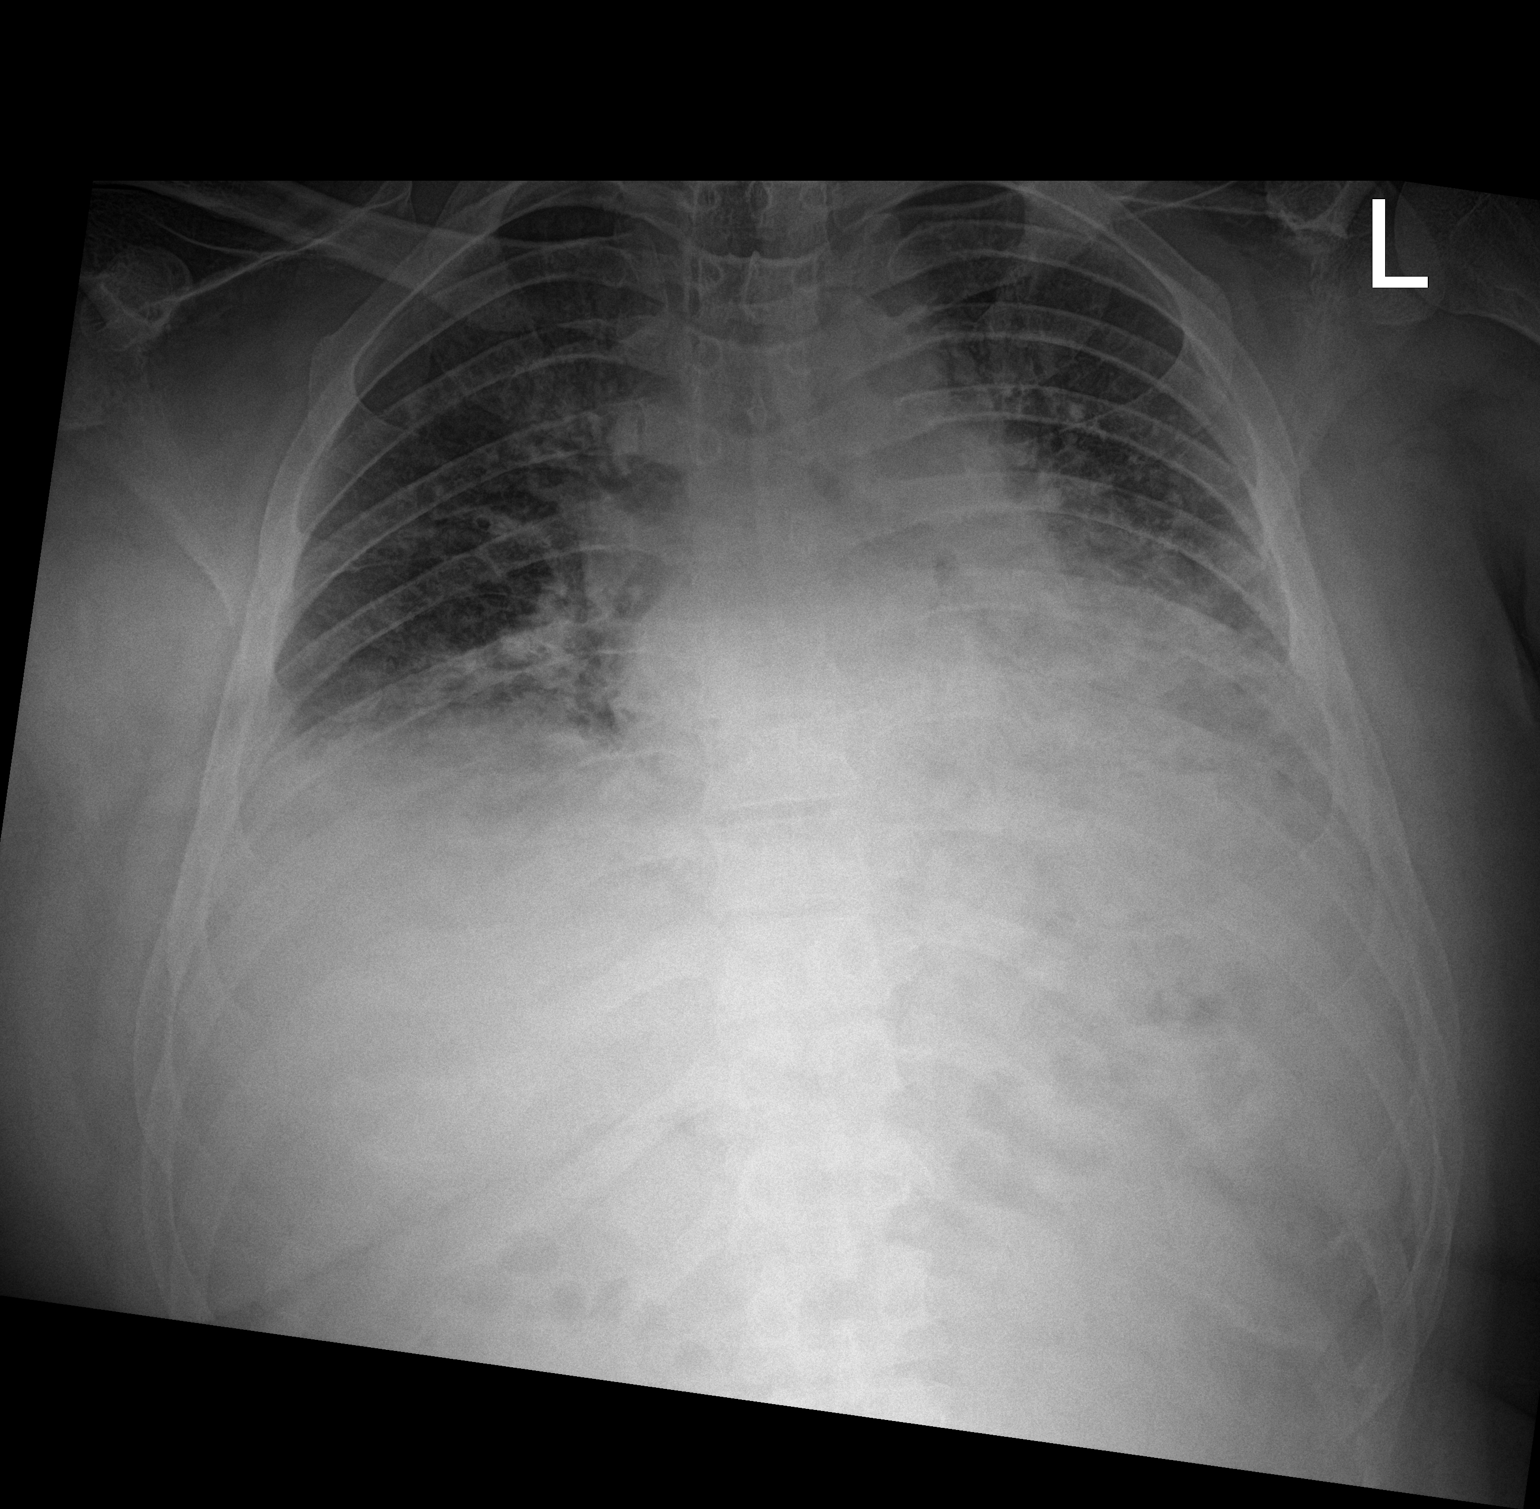

[1 of 1 positions shown; findings below may reference images not displayed]

FINDINGS: There is patchy airspace opacity in each lower lobe region, slightly
more extensive on the left than on the right. There is a
questionable small left pleural effusion. Heart is upper normal in
size with pulmonary vascularity normal. No adenopathy. No bone
lesions.
IMPRESSION: Bibasilar airspace opacity, more on the left than on the right, felt
to be indicative of multifocal pneumonia. Question minimal left
pleural effusion. Heart upper normal in size. No evident adenopathy.
